# Patient Record
Sex: Female | Born: 1965 | Hispanic: No | Smoking: Never smoker
Health system: Southern US, Community
[De-identification: ages and names within clinical notes are randomized; demographics above are authoritative.]

---

## 2011-12-13 ENCOUNTER — Ambulatory Visit: Payer: Self-pay | Admitting: Cardiovascular Disease

## 2012-01-01 ENCOUNTER — Ambulatory Visit: Payer: Self-pay | Admitting: Cardiovascular Disease

## 2015-08-30 ENCOUNTER — Ambulatory Visit: Payer: Medicaid Other | Attending: Internal Medicine

## 2015-09-20 ENCOUNTER — Ambulatory Visit: Payer: Self-pay | Admitting: Family Medicine

## 2015-09-24 ENCOUNTER — Telehealth: Payer: Self-pay | Admitting: *Deleted

## 2015-09-24 NOTE — Telephone Encounter (Signed)
Unable to reach patient at time of Pre-Visit Call. No voicemail setup. 

## 2015-09-27 ENCOUNTER — Ambulatory Visit: Payer: Self-pay | Admitting: Family Medicine

## 2015-11-04 ENCOUNTER — Other Ambulatory Visit (HOSPITAL_BASED_OUTPATIENT_CLINIC_OR_DEPARTMENT_OTHER): Payer: Self-pay

## 2015-11-04 DIAGNOSIS — G4730 Sleep apnea, unspecified: Secondary | ICD-10-CM

## 2015-11-04 DIAGNOSIS — R0683 Snoring: Secondary | ICD-10-CM

## 2015-12-17 ENCOUNTER — Ambulatory Visit (HOSPITAL_BASED_OUTPATIENT_CLINIC_OR_DEPARTMENT_OTHER): Payer: Medicaid Other

## 2016-02-29 ENCOUNTER — Ambulatory Visit (HOSPITAL_BASED_OUTPATIENT_CLINIC_OR_DEPARTMENT_OTHER): Payer: Medicaid Other | Attending: Specialist | Admitting: Internal Medicine

## 2016-02-29 VITALS — Ht 62.0 in | Wt 282.0 lb

## 2016-02-29 DIAGNOSIS — E669 Obesity, unspecified: Secondary | ICD-10-CM | POA: Insufficient documentation

## 2016-02-29 DIAGNOSIS — G4733 Obstructive sleep apnea (adult) (pediatric): Secondary | ICD-10-CM | POA: Diagnosis not present

## 2016-02-29 DIAGNOSIS — G4730 Sleep apnea, unspecified: Secondary | ICD-10-CM | POA: Diagnosis present

## 2016-02-29 DIAGNOSIS — R5383 Other fatigue: Secondary | ICD-10-CM | POA: Diagnosis not present

## 2016-02-29 DIAGNOSIS — Z6841 Body Mass Index (BMI) 40.0 and over, adult: Secondary | ICD-10-CM | POA: Diagnosis not present

## 2016-02-29 DIAGNOSIS — R0683 Snoring: Secondary | ICD-10-CM

## 2016-03-05 DIAGNOSIS — R0683 Snoring: Secondary | ICD-10-CM | POA: Diagnosis not present

## 2016-03-05 NOTE — Procedures (Signed)
Patient Name: Jocelyn Hart, Jocelyn Hart Date: 02/29/2016 Gender: Female D.O.B: 1965/10/01 Age (years): 39 Referring Provider: Jac Canavan MD Height (inches): 62 Interpreting Physician: Baird Lyons MD, ABSM Weight (lbs): 282 RPSGT: Carolin Coy BMI: 16 MRN: [id number] Neck Size: 15.00 CLINICAL INFORMATION Sleep Study Type: Split Night CPAP  Indication for sleep study: Fatigue, Obesity, OSA, Snoring  Epworth Sleepiness Score: 6  SLEEP STUDY TECHNIQUE As per the AASM Manual for the Scoring of Sleep and Associated Events v2.3 (April 2016) with a hypopnea requiring 4% desaturations.  The channels recorded and monitored were frontal, central and occipital EEG, electrooculogram (EOG), submentalis EMG (chin), nasal and oral airflow, thoracic and abdominal wall motion, anterior tibialis EMG, snore microphone, electrocardiogram, and pulse oximetry. Continuous positive airway pressure (CPAP) was initiated when the patient met split night criteria and was titrated according to treat sleep-disordered breathing.  MEDICATIONS Medications self-administered by patient taken the night of the study : none reported  RESPIRATORY PARAMETERS Diagnostic  Total AHI (/hr): 73.3 RDI (/hr): 77.5 OA Index (/hr): 4.6 CA Index (/hr): 0.0 REM AHI (/hr): 96.0 NREM AHI (/hr): 70.2 Supine AHI (/hr): 64.6 Non-supine AHI (/hr): 97.67 Min O2 Sat (%): 74.00 Mean O2 (%): 93.85 Time below 88% (min): 6.5   Titration  Optimal Pressure (cm): 12 AHI at Optimal Pressure (/hr): 0.0 Min O2 at Optimal Pressure (%): 94.0 Supine % at Optimal (%): 100 Sleep % at Optimal (%): 100    SLEEP ARCHITECTURE The recording time for the entire night was 396.5 minutes.  During a baseline period of 186.9 minutes, the patient slept for 144.0 minutes in REM and nonREM, yielding a sleep efficiency of 77.1%. Sleep onset after lights out was 22.3 minutes with a REM latency of 121.0 minutes. The patient spent 13.19% of the night in stage  N1 sleep, 74.65% in stage N2 sleep, 0.00% in stage N3 and 12.15% in REM.  During the titration period of 201.0 minutes, the patient slept for 189.0 minutes in REM and nonREM, yielding a sleep efficiency of 94.0%. Sleep onset after CPAP initiation was 8.3 minutes with a REM latency of 83.5 minutes. The patient spent 4.23% of the night in stage N1 sleep, 65.87% in stage N2 sleep, 0.00% in stage N3 and 29.89% in REM.  CARDIAC DATA The 2 lead EKG demonstrated sinus rhythm. The mean heart rate was 76.46 beats per minute. Other EKG findings include: None.  LEG MOVEMENT DATA The total Periodic Limb Movements of Sleep (PLMS) were 0. The PLMS index was 0.00 .  IMPRESSIONS - Severe obstructive sleep apnea occurred during the diagnostic portion of the study (AHI = 73.3/hour). An optimal PAP pressure was selected for this patient ( 12 cm of water) - No significant central sleep apnea occurred during the diagnostic portion of the study (CAI = 0.0/hour). - Mild oxygen desaturation was noted during the diagnostic portion of the study (Min O2 = 74.00%). - The patient snored with Moderate snoring volume during the diagnostic portion of the study. - No cardiac abnormalities were noted during this study. - Clinically significant periodic limb movements did not occur during sleep.  DIAGNOSIS - Obstructive Sleep Apnea (327.23 [G47.33 ICD-10])  RECOMMENDATIONS - Trial of CPAP therapy on 12 cm H2O with a Medium size Resmed Full Face Mask AirFit F20 mask and heated humidification. - Avoid alcohol, sedatives and other CNS depressants that may worsen sleep apnea and disrupt normal sleep architecture. - Sleep hygiene should be reviewed to assess factors that may improve sleep quality. - Weight management  and regular exercise should be initiated or continued.  [Electronically signed] 03/05/2016 10:15 AM  Baird Lyons MD, ABSM Diplomate, American Board of Sleep Medicine   NPI: [id number]  [place], American Board of Sleep Medicine  ELECTRONICALLY SIGNED ON:  03/05/2016, 10:16 AM [place] PH: [phone]   FX: [phone] [place]

## 2016-03-07 ENCOUNTER — Other Ambulatory Visit: Payer: Self-pay | Admitting: Cardiology

## 2016-03-07 ENCOUNTER — Ambulatory Visit (INDEPENDENT_AMBULATORY_CARE_PROVIDER_SITE_OTHER): Payer: Medicaid Other

## 2016-03-07 ENCOUNTER — Encounter: Payer: Self-pay | Admitting: Cardiology

## 2016-03-07 ENCOUNTER — Ambulatory Visit (INDEPENDENT_AMBULATORY_CARE_PROVIDER_SITE_OTHER): Payer: Medicaid Other | Admitting: Internal Medicine

## 2016-03-07 ENCOUNTER — Other Ambulatory Visit: Payer: Self-pay

## 2016-03-07 ENCOUNTER — Encounter: Payer: Self-pay | Admitting: Internal Medicine

## 2016-03-07 ENCOUNTER — Ambulatory Visit (INDEPENDENT_AMBULATORY_CARE_PROVIDER_SITE_OTHER): Payer: Medicaid Other | Admitting: Cardiology

## 2016-03-07 VITALS — BP 138/72 | HR 85 | Ht 61.0 in | Wt 276.0 lb

## 2016-03-07 VITALS — BP 170/106 | HR 75 | Ht 62.0 in | Wt 275.8 lb

## 2016-03-07 DIAGNOSIS — I10 Essential (primary) hypertension: Secondary | ICD-10-CM | POA: Diagnosis not present

## 2016-03-07 DIAGNOSIS — E6609 Other obesity due to excess calories: Secondary | ICD-10-CM | POA: Diagnosis not present

## 2016-03-07 DIAGNOSIS — IMO0001 Reserved for inherently not codable concepts without codable children: Secondary | ICD-10-CM

## 2016-03-07 DIAGNOSIS — R0602 Shortness of breath: Secondary | ICD-10-CM

## 2016-03-07 DIAGNOSIS — M79606 Pain in leg, unspecified: Secondary | ICD-10-CM

## 2016-03-07 DIAGNOSIS — G4733 Obstructive sleep apnea (adult) (pediatric): Secondary | ICD-10-CM

## 2016-03-07 DIAGNOSIS — M79604 Pain in right leg: Secondary | ICD-10-CM

## 2016-03-07 DIAGNOSIS — Z6841 Body Mass Index (BMI) 40.0 and over, adult: Secondary | ICD-10-CM

## 2016-03-07 NOTE — Progress Notes (Signed)
[facility] Consultation      Date: 03/07/2016,   MRN# [id number] Jocelyn Hart 03-20-1965 Code Status:  Code Status History    This patient does not have a recorded code status. Please follow your organizational policy for patients in this situation.     Hosp day:@LENGTHOFSTAYDAYS @ Referring MD: @ATDPROV @     PCP:      AdmissionWeight: 276 lb (125.2 kg)                 CurrentWeight: 276 lb (125.2 kg) Jocelyn FoyerZilehuma Zeek is a 51 y.o. old female seen in consultation for sleep problems at the request of Dr. Shawnie Ponsorn.     CHIEF COMPLAINT:   Problems with excessive daytime fatigue and sleepiness   HISTORY OF PRESENT ILLNESS   51 yo pleasant obese female seen today for problems with sleeping, snoring and fatigue, this has been going on for about 2 years  Patient has been having excessive daytime sleepiness Patient has been having extreme fatigue and tiredness, lack of energy Patient has been told that she has very  Loud snoring every night Patient has been told that she struggles to breathe at night and gasps for air She also states that she is sometimes depressed and has memory loss She has gained 60 pounds in last 1 years-she feels her sleep problems affecting her mood Patient has sleep study that confirms OSA with AHI 73 Patient was placed on full mask CPAP 12 cm h20 during the study, she states that she felt good after her sleep study Patient here for assessment and establish care Pateint   PAST MEDICAL HISTORY   No past medical history on file.   SURGICAL HISTORY   Past Surgical History:  Procedure Laterality Date  . CESAREAN SECTION       FAMILY HISTORY   Family History  Problem Relation Age of Onset  . Diabetes Mother   . Breast cancer Mother   . Diabetes Father   . Stroke Paternal Grandfather      SOCIAL HISTORY   Social History  Substance Use Topics  . Smoking status: Never Smoker  . Smokeless tobacco: Never Used  . Alcohol use  No  works as Marketing executivelinguist and truck driver Works as   MEDICATIONS    Home Medication:  Current Outpatient Rx  . Order #: [id number] Class: Historical Med    Current Medication:  Current Outpatient Prescriptions:  .  ibuprofen (ADVIL,MOTRIN) 800 MG tablet, Take 800 mg by mouth every 8 (eight) hours as needed., Disp: , Rfl:     ALLERGIES   Patient has no allergy information on record.     REVIEW OF SYSTEMS   Review of Systems  Constitutional: Positive for malaise/fatigue. Negative for chills, diaphoresis, fever and weight loss.  HENT: Negative for congestion and hearing loss.   Eyes: Negative for blurred vision and double vision.  Respiratory: Positive for shortness of breath. Negative for cough, hemoptysis, sputum production and wheezing.   Cardiovascular: Negative for chest pain, palpitations and orthopnea.  Gastrointestinal: Negative for abdominal pain, heartburn, nausea and vomiting.  Genitourinary: Negative for dysuria and urgency.  Musculoskeletal: Negative for back pain, myalgias and neck pain.  Skin: Negative for rash.  Neurological: Negative for dizziness, tingling, tremors, weakness and headaches.  Endo/Heme/Allergies: Does not bruise/bleed easily.  Psychiatric/Behavioral: Negative for depression, substance abuse and suicidal ideas.  All other systems reviewed and are negative.    VS: BP 138/72 (BP Location: Left Arm, Cuff Size: Normal)   Pulse 85  Ht 5\' 1"  (1.549 m)   Wt 276 lb (125.2 kg)   SpO2 95%   BMI 52.15 kg/m      PHYSICAL EXAM  Physical Exam  Constitutional: She is oriented to person, place, and time. She appears well-developed and well-nourished. No distress.  HENT:  Head: Normocephalic and atraumatic.  Mouth/Throat: No oropharyngeal exudate.  Eyes: EOM are normal. Pupils are equal, round, and reactive to light. No scleral icterus.  Neck: Normal range of motion. Neck supple.  Cardiovascular: Normal rate, regular rhythm and normal heart  sounds.   No murmur heard. Pulmonary/Chest: No stridor. No respiratory distress. She has no wheezes.  Abdominal: Soft. Bowel sounds are normal.  Musculoskeletal: Normal range of motion. She exhibits no edema.  Neurological: She is alert and oriented to person, place, and time. No cranial nerve deficit.  Skin: Skin is warm. She is not diaphoretic.  Psychiatric: She has a normal mood and affect.       IMAGING  No imaging found in chart  No imaging  ASSESSMENT/PLAN   51 yo pleasant [place]  Female with dx of severe OSA with AHI 73  1.recommend diet and exercise and weight loss 2.will start CPAP therapy at 12 cm h20 and then re-assess AHI with compliance report  Follow up in 2-3 months    I have personally obtained a history, examined the patient, evaluated laboratory and independently reviewed imaging results, formulated the assessment and plan and placed orders.  The Patient requires high complexity decision making for assessment and support, frequent evaluation and titration of therapies, application of advanced monitoring technologies and extensive interpretation of multiple databases.   Patient/Family are satisfied with Plan of action and management. All questions answered  Lucie Leather, M.D.  [facility]  Medical Director [facility] Medical Director [facility] Cardio-Pulmonary Department

## 2016-03-07 NOTE — Patient Instructions (Signed)
MASK OF CHOICE FOR SLEEP APNEA START CPAP 12 cm h20 Recommend Weight Loss

## 2016-03-07 NOTE — Patient Instructions (Addendum)
Testing/Procedures: Your physician has requested that you have an echocardiogram. Echocardiography is a painless test that uses sound waves to create images of your heart. It provides your doctor with information about the size and shape of your heart and how well your heart's chambers and valves are working. This procedure takes approximately one hour. There are no restrictions for this procedure.  Your physician has requested that you have an ankle brachial index (ABI). During this test an ultrasound and blood pressure cuff are used to evaluate the arteries that supply the arms and legs with blood. Allow thirty minutes for this exam. There are no restrictions or special instructions.  [facility] MY[facility]IEW  Your caregiver has ordered a Stress Test with nuclear imaging. The purpose of this test is to evaluate the blood supply to your heart muscle. This procedure is referred to as a "Non-Invasive Stress Test." This is because other than having an IV started in your vein, nothing is inserted or "invades" your body. Cardiac stress tests are done to find areas of poor blood flow to the heart by determining the extent of coronary artery disease (CAD). Some patients exercise on a treadmill, which naturally increases the blood flow to your heart, while others who are  unable to walk on a treadmill due to physical limitations have a pharmacologic/chemical stress agent called Lexiscan . This medicine will mimic walking on a treadmill by temporarily increasing your coronary blood flow.   Please note: these test may take anywhere between 2-4 hours to complete  [facility]  THE VOLUNTEERS AT THE FIRST DESK WILL DIRECT YOU WHERE TO GO  Date of Procedure:_Tuesday March 14, 2016 at 07:30AM_  Arrival Time for Procedure:_Arrive at 07:15AM__   PLEASE NOTIFY THE OFFICE AT LEAST 24 HOURS IN ADVANCE IF YOU ARE UNABLE TO KEEP YOUR APPOINTMENT.  [phone] AND  PLEASE NOTIFY NUCLEAR  MEDICINE AT [facility] AT LEAST 24 HOURS IN ADVANCE IF YOU ARE UNABLE TO KEEP YOUR APPOINTMENT. [phone]  How to prepare for your Myoview test:  1. Do not eat or drink after midnight 2. No caffeine for 24 hours prior to test 3. No smoking 24 hours prior to test. 4. Your medication may be taken with water.  If your doctor stopped a medication because of this test, do not take that medication. 5. Ladies, please do not wear dresses.  Skirts or pants are appropriate. Please wear a short sleeve shirt. 6. No perfume, cologne or lotion. 7. Wear comfortable walking shoes. No heels!      Follow-Up: Your physician has requested that you regularly monitor and record your blood pressure readings at home. Please use the same machine at the same time of day to check your readings and record them to bring to your follow-up visit.   Your physician recommends that you schedule a follow-up appointment after testing with Dr. Alvino ChapelIngal.  It was a pleasure seeing you today here in the office. Please do not hesitate to give us a call back if you have any further questions. [phone]  [place] CellarPamela A. RN, BSN    Echocardiogram An echocardiogram, or echocardiography, uses sound waves (ultrasound) to produce an image of your heart. The echocardiogram is simple, painless, obtained within a short period of time, and offers valuable information to your health care provider. The images from an echocardiogram can provide information such as:  Evidence of coronary artery disease (CAD).  Heart size.  Heart muscle function.  Heart valve function.  Aneurysm detection.  Evidence of a past heart attack.  Fluid buildup around the heart.  Heart muscle thickening.  Assess heart valve function. Tell a health care provider about:  Any allergies you have.  All medicines you are taking, including vitamins, herbs, eye drops, creams, and over-the-counter medicines.  Any problems you or family members have had with  anesthetic medicines.  Any blood disorders you have.  Any surgeries you have had.  Any medical conditions you have.  Whether you are pregnant or may be pregnant. What happens before the procedure? No special preparation is needed. Eat and drink normally. What happens during the procedure?  In order to produce an image of your heart, gel will be applied to your chest and a wand-like tool (transducer) will be moved over your chest. The gel will help transmit the sound waves from the transducer. The sound waves will harmlessly bounce off your heart to allow the heart images to be captured in real-time motion. These images will then be recorded.  You may need an IV to receive a medicine that improves the quality of the pictures. What happens after the procedure? You may return to your normal schedule including diet, activities, and medicines, unless your health care provider tells you otherwise. This information is not intended to replace advice given to you by your health care provider. Make sure you discuss any questions you have with your health care provider. Document Released: 01/14/2000 Document Revised: 09/04/2015 Document Reviewed: 09/23/2012 Elsevier Interactive Patient Education  2017 Elsevier Inc. Ankle-Brachial Index Test Introduction The ankle-brachial index (ABI) test is used to find peripheral vascular disease (PVD). PVD is also known as peripheral arterial disease (PAD). PVD is the blocking or hardening of the arteries anywhere within the circulatory system beyond the heart. PVD is caused by cholesterol deposits in your blood vessels (atherosclerosis). These deposits cause arteries to narrow. The delivery of oxygen to your tissues is impaired as a result. This can cause muscle pain and fatigue. This is called claudication. PVD means there may also be buildup of cholesterol in your:  Heart. This increases the risk of heart attacks.  Brain. This increases the risk of  strokes. The ankle-brachial index test measures the blood flow in your arms and legs. This test also determines if blood vessels in your leg are narrowed by cholesterol deposits. There are additional causes of a reduced ankle-brachial index, such as inflammation of vessels or a clot in the vessels. However, these are much less common than narrowing due to cholesterol deposits. What is being tested? The test is done while you are lying down and resting. Measurements are taken of the systolic pressure:  In your arm (brachial).  In your ankle at several points along your leg. Systolic pressure is the pressure inside your arteries when your heart pumps. The measurements are taken several times on both sides. Then, the highest systolic pressure of the ankle is divided by the highest brachial systolic pressure. The result is the ankle-brachial pressure ratio, or ABI. Sometimes this test is repeated after you have exercised on a treadmill for five minutes. You may have leg pain during the exercise portion of the test if you suffer from PAD. If the index number drops after exercise, this may show that PAD is present. A normal ABI ratio is between 0.9 and 1.4. A value below 0.9 is considered abnormal. This information is not intended to replace advice given to you by your health care provider. Make sure you discuss any questions you have  with your health care provider. Document Released: 01/21/2004 Document Revised: 06/24/2015 Document Reviewed: 08/22/2013  2017 Elsevier Pharmacologic Stress Electrocardiogram A pharmacologic stress electrocardiogram is a heart (cardiac) test that uses nuclear imaging to evaluate the blood supply to your heart. This test may also be called a pharmacologic stress electrocardiography. Pharmacologic means that a medicine is used to increase your heart rate and blood pressure.  This stress test is done to find areas of poor blood flow to the heart by determining the extent of  coronary artery disease (CAD). Some people exercise on a treadmill, which naturally increases the blood flow to the heart. For those people unable to exercise on a treadmill, a medicine is used. This medicine stimulates your heart and will cause your heart to beat harder and more quickly, as if you were exercising.  Pharmacologic stress tests can help determine:  The adequacy of blood flow to your heart during increased levels of activity in order to clear you for discharge home.  The extent of coronary artery blockage caused by CAD.  Your prognosis if you have suffered a heart attack.  The effectiveness of cardiac procedures done, such as an angioplasty, which can increase the circulation in your coronary arteries.  Causes of chest pain or pressure. LET [facility] CARE PROVIDER KNOW ABOUT:  Any allergies you have.  All medicines you are taking, including vitamins, herbs, eye drops, creams, and over-the-counter medicines.  Previous problems you or members of your family have had with the use of anesthetics.  Any blood disorders you have.  Previous surgeries you have had.  Medical conditions you have.  Possibility of pregnancy, if this applies.  If you are currently breastfeeding. RISKS AND COMPLICATIONS Generally, this is a safe procedure. However, as with any procedure, complications can occur. Possible complications include:  You develop pain or pressure in the following areas:  Chest.  Jaw or neck.  Between your shoulder blades.  Radiating down your left arm.  Headache.  Dizziness or light-headedness.  Shortness of breath.  Increased or irregular heartbeat.  Low blood pressure.  Nausea or vomiting.  Flushing.  Redness going up the arm and slight pain during injection of medicine.  Heart attack (rare). BEFORE THE PROCEDURE   Avoid all forms of caffeine for 24 hours before your test or as directed by your health care provider. This includes coffee, tea  (even decaffeinated tea), caffeinated sodas, chocolate, cocoa, and certain pain medicines.  Follow your health care provider's instructions regarding eating and drinking before the test.  Take your medicines as directed at regular times with water unless instructed otherwise. Exceptions may include:  If you have diabetes, ask how you are to take your insulin or pills. It is common to adjust insulin dosing the morning of the test.  If you are taking beta-blocker medicines, it is important to talk to your health care provider about these medicines well before the date of your test. Taking beta-blocker medicines may interfere with the test. In some cases, these medicines need to be changed or stopped 24 hours or more before the test.  If you wear a nitroglycerin patch, it may need to be removed prior to the test. Ask your health care provider if the patch should be removed before the test.  If you use an inhaler for any breathing condition, bring it with you to the test.  If you are an outpatient, bring a snack so you can eat right after the stress phase of the test.  Do not smoke for 4 hours prior to the test or as directed by your health care provider.  Do not apply lotions, powders, creams, or oils on your chest prior to the test.  Wear comfortable shoes and clothing. Let your health care provider know if you were unable to complete or follow the preparations for your test. PROCEDURE   Multiple patches (electrodes) will be put on your chest. If needed, small areas of your chest may be shaved to get better contact with the electrodes. Once the electrodes are attached to your body, multiple wires will be attached to the electrodes, and your heart rate will be monitored.  An IV access will be started. A nuclear trace (isotope) is given. The isotope may be given intravenously, or it may be swallowed. Nuclear refers to several types of radioactive isotopes, and the nuclear isotope lights up the  arteries so that the nuclear images are clear. The isotope is absorbed by your body. This results in low radiation exposure.  A resting nuclear image is taken to show how your heart functions at rest.  A medicine is given through the IV access.  A second scan is done about 1 hour after the medicine injection and determines how your heart functions under stress.  During this stress phase, you will be connected to an electrocardiogram machine. Your blood pressure and oxygen levels will be monitored. AFTER THE PROCEDURE   Your heart rate and blood pressure will be monitored after the test.  You may return to your normal schedule, including diet,activities, and medicines, unless your health care provider tells you otherwise. This information is not intended to replace advice given to you by your health care provider. Make sure you discuss any questions you have with your health care provider. Document Released: 06/04/2008 Document Revised: 01/21/2013 Document Reviewed: 09/23/2012 Elsevier Interactive Patient Education  2017 [employer].

## 2016-03-07 NOTE — Progress Notes (Signed)
Cardiology Office Note   Date:  03/07/2016   ID:  Jocelyn FoyerZilehuma Rusher, DOB Aug 20, 1965, MRN [id number]  Referring Doctor:  No primary care provider on file.   Cardiologist:   Almond LintAileen Torrion Witter, MD   Reason for consultation:  Chief Complaint  Patient presents with  . other     New patient. Patient c/o elevated BP, leg pain . Meds reviewed verbally with patient.       History of Present Illness: Jocelyn Hart is a 51 y.o. female who presents for Evaluation of elevated blood pressure, leg pain.  Patient reports that her blood pressure has been up and down for the last one or 2 years, more noticeable in the last 6 months or so. She was previously prescribed amlodipine 5 mg by mouth daily but only took it once due to possible side effect, did not feel well after taking the amlodipine. She went to her gynecologist yesterday and was told that she needed to see her PCP because her blood pressure was very high, in the stroke range. She established care with a new PCP and was recommended to keep this cardiology appointment today. She was not prescribed any medications. They did order some routine blood work.  Yesterday, at the PCPs office, her blood pressure was in the 180s systolic. She denied chest pain and shortness of breath at the time. No lightheadedness or dizziness. She did complain of just not feeling well over all, left arm discomfort at that time.  She does report shortness of breath with exertion. No chest pain. She mentions that she had seen a physician in the past and was told that she needed to do a stress test. She did not go for that stress test.  She was recently diagnosed to have severe obstructive sleep apnea and is waiting for CPAP.  She reports of pain in the back of the legs, more on the right versus left., probably worse with walking. She is not clear about this symptom because she also mentions that there is some cramping sensation at rest as well.     ROS:  Please see the  history of present illness. Aside from mentioned under HPI, all other systems are reviewed and negative.     History reviewed. No pertinent past medical history.  Past Surgical History:  Procedure Laterality Date  . CESAREAN SECTION       reports that she has never smoked. She has never used smokeless tobacco. She reports that she does not drink alcohol or use drugs.   family history includes Breast cancer in her mother; Diabetes in her father and mother; Stroke in her paternal grandfather.   Outpatient Medications Prior to Visit  Medication Sig Dispense Refill  . ibuprofen (ADVIL,MOTRIN) 800 MG tablet Take 800 mg by mouth every 8 (eight) hours as needed.     No facility-administered medications prior to visit.      Allergies: Patient has no allergy information on record.    PHYSICAL EXAM: VS:  BP (!) 170/106 (BP Location: Right Arm, Patient Position: Sitting, Cuff Size: Large)   Pulse 75   Ht 5\' 2"  (1.575 m)   Wt 275 lb 12 oz (125.1 kg)   BMI 50.44 kg/m  , Body mass index is 50.44 kg/m. Wt Readings from Last 3 Encounters:  03/07/16 275 lb 12 oz (125.1 kg)  03/07/16 276 lb (125.2 kg)  02/29/16 282 lb (127.9 kg)    GENERAL:  well developed, well nourished, morbidly obese, not in acute  distress HEENT: normocephalic, pink conjunctivae, anicteric sclerae, no xanthelasma, normal dentition, oropharynx clear NECK:  no neck vein engorgement, JVP normal, no hepatojugular reflux, carotid upstroke brisk and symmetric, no bruit, no thyromegaly, no lymphadenopathy LUNGS:  good respiratory effort, clear to auscultation bilaterally CV:  PMI not displaced, no thrills, no lifts, S1 and S2 within normal limits, no palpable S3 or S4, no murmurs, no rubs, no gallops ABD:  Soft, nontender, normoactive bowel sounds, difficult to assess for aortic bruit, hepatomegaly, splenomegaly MS: nontender back, no kyphosis, no scoliosis, no joint deformities EXT:  1+ DP/PT pulses, no edema, no  varicosities, no cyanosis, no clubbing SKIN: warm, nondiaphoretic, normal turgor, no ulcers NEUROPSYCH: alert, oriented to person, place, and time, sensory/motor grossly intact, normal mood, appropriate affect  Recent Labs: No results found for requested labs within last 8760 hours.   Lipid Panel No results found for: CHOL, TRIG, HDL, CHOLHDL, VLDL, LDLCALC, LDLDIRECT   Other studies Reviewed:  EKG:  The ekg from 03/07/2016 was personally reviewed by me and it revealed sinus rhythm, 75 BPM. Possible right atrial enlargement.  Additional studies/ records that were reviewed personally reviewed by me today include: None available   ASSESSMENT AND PLAN: Elevated blood pressure, likely hypertension Resume amlodipine 5 mg by mouth daily Blood pressure log. May need to up titrate if not goal of less than 140/80. Salt restriction. Weight loss. Avoid NSAIDs. Agree with CPAP for OSA.  Shortness breath Recommend echocardiogram Recommend Lexiscan stress test.   Leg pain Possible claudication. Recommend ABiS. Recommend follow-up with PCP for further evaluation as well. Consider spine imaging to see if this is related to stenosis.  Obesity Body mass index is 50.44 kg/m.Marland Kitchen Recommend aggressive weight loss through diet and increased physical activity.    Current medicines are reviewed at length with the patient today.  The patient does not have concerns regarding medicines.  Labs/ tests ordered today include:  Orders Placed This Encounter  Procedures  . NM Myocar Multi W/Spect W/Wall Motion / EF  . EKG 12-Lead  . ECHOCARDIOGRAM COMPLETE    I had a lengthy and detailed discussion with the patient regarding diagnoses, prognosis, diagnostic options, treatment options  and side effects of medications.   I counseled the patient on importance of lifestyle modification including heart healthy diet, regular physical activity .   Disposition:   FU with undersigned after tests  Thank  you for this consultation. We will forwarding this consultation to referring physician.   Signed, Almond Lint, MD  03/07/2016 12:59 PM    [facility]  This note was generated in part with voice recognition software and I apologize for any typographical errors that were not detected and corrected.

## 2016-03-08 ENCOUNTER — Ambulatory Visit: Payer: Medicaid Other

## 2016-03-08 DIAGNOSIS — I7389 Other specified peripheral vascular diseases: Secondary | ICD-10-CM | POA: Diagnosis not present

## 2016-03-08 DIAGNOSIS — R202 Paresthesia of skin: Secondary | ICD-10-CM | POA: Diagnosis not present

## 2016-03-08 DIAGNOSIS — M79606 Pain in leg, unspecified: Secondary | ICD-10-CM

## 2016-03-08 LAB — ECHOCARDIOGRAM COMPLETE
Height: 62 in
Weight: 4412 oz

## 2016-03-14 ENCOUNTER — Other Ambulatory Visit
Admission: RE | Admit: 2016-03-14 | Discharge: 2016-03-14 | Disposition: A | Discharge status: Home / Self Care | Payer: Medicaid Other | Source: Ambulatory Visit | Attending: Family Medicine | Admitting: Family Medicine

## 2016-03-14 ENCOUNTER — Ambulatory Visit
Admission: RE | Admit: 2016-03-14 | Discharge: 2016-03-14 | Disposition: A | Discharge status: Home / Self Care | Payer: Medicaid Other | Source: Ambulatory Visit | Attending: Cardiology | Admitting: Cardiology

## 2016-03-14 DIAGNOSIS — R0602 Shortness of breath: Secondary | ICD-10-CM

## 2016-03-14 DIAGNOSIS — I10 Essential (primary) hypertension: Secondary | ICD-10-CM | POA: Insufficient documentation

## 2016-03-14 LAB — NM MYOCAR MULTI W/SPECT W/WALL MOTION / EF
CHL CUP RESTING HR STRESS: 72 {beats}/min
CHL CUP STRESS STAGE 1 SPEED: 0 mph
CHL CUP STRESS STAGE 2 GRADE: 0 %
CHL CUP STRESS STAGE 2 HR: 83 {beats}/min
CHL CUP STRESS STAGE 2 SPEED: 0 mph
CHL CUP STRESS STAGE 3 HR: 96 {beats}/min
CHL CUP STRESS STAGE 4 SPEED: 0 mph
CHL CUP STRESS STAGE 5 DBP: 75 mmHg
CHL CUP STRESS STAGE 5 HR: 83 {beats}/min
CSEPEW: 1 METS
CSEPHR: 56 %
CSEPPMHR: 55 %
LV dias vol: 79 mL (ref 46–106)
LV sys vol: 30 mL
Peak HR: 96 {beats}/min
Stage 1 Grade: 0 %
Stage 1 HR: 82 {beats}/min
Stage 3 Grade: 0 %
Stage 3 Speed: 0 mph
Stage 4 Grade: 0 %
Stage 4 HR: 93 {beats}/min
Stage 5 Grade: 0 %
Stage 5 SBP: 127 mmHg
Stage 5 Speed: 0 mph
TID: 1.14

## 2016-03-14 LAB — PREGNANCY, URINE: PREG TEST UR: NEGATIVE

## 2016-03-14 MED ORDER — TECHNETIUM TC 99M TETROFOSMIN IV KIT: 33.0000 | PACK | Freq: Once | INTRAVENOUS | Status: DC | PRN

## 2016-03-14 MED ORDER — REGADENOSON 0.4 MG/5ML IV SOLN
0.4000 mg | Freq: Once | INTRAVENOUS | Status: AC
  Administered 2016-03-14: 0.4 mg via INTRAVENOUS
  Filled 2016-03-14: qty 5

## 2016-03-14 MED ORDER — TECHNETIUM TC 99M TETROFOSMIN IV KIT
13.0000 | PACK | Freq: Once | INTRAVENOUS | Status: AC | PRN
  Administered 2016-03-14: 13.08 via INTRAVENOUS

## 2016-03-14 MED ORDER — TECHNETIUM TC 99M TETROFOSMIN IV KIT
30.0270 | PACK | Freq: Once | INTRAVENOUS | Status: AC | PRN
  Administered 2016-03-14: 30.027 via INTRAVENOUS

## 2016-03-21 ENCOUNTER — Encounter: Payer: Self-pay | Admitting: Cardiology

## 2016-03-21 ENCOUNTER — Ambulatory Visit: Payer: Medicaid Other | Admitting: Cardiology

## 2016-03-21 ENCOUNTER — Ambulatory Visit (INDEPENDENT_AMBULATORY_CARE_PROVIDER_SITE_OTHER): Payer: Medicaid Other | Admitting: Cardiology

## 2016-03-21 VITALS — BP 140/90 | HR 90 | Ht 61.0 in | Wt 277.5 lb

## 2016-03-21 DIAGNOSIS — R0602 Shortness of breath: Secondary | ICD-10-CM

## 2016-03-21 DIAGNOSIS — Z6841 Body Mass Index (BMI) 40.0 and over, adult: Secondary | ICD-10-CM

## 2016-03-21 DIAGNOSIS — E6609 Other obesity due to excess calories: Secondary | ICD-10-CM

## 2016-03-21 DIAGNOSIS — IMO0001 Reserved for inherently not codable concepts without codable children: Secondary | ICD-10-CM

## 2016-03-21 DIAGNOSIS — I10 Essential (primary) hypertension: Secondary | ICD-10-CM

## 2016-03-21 NOTE — Progress Notes (Signed)
other

## 2016-03-21 NOTE — Progress Notes (Signed)
Cardiology Office Note   Date:  03/21/2016   ID:  Jocelyn Hart, DOB 1965/08/22, MRN [id number]  Referring Doctor:  No PCP Per Patient   Cardiologist:   Almond Lint, MD   Reason for consultation:  Chief Complaint  Patient presents with  . other    echo/abi fu. Pt states she's been feeling much better since starting CPAP machine. Reviewed meds with pt verbally.      History of Present Illness: Jocelyn Hart is a 51 y.o. female who presents forFollow-up after testing, hypertension   Review records from 03/07/2016: Patient reports that her blood pressure has been up and down for the last one or 2 years, more noticeable in the last 6 months or so. She was previously prescribed amlodipine 5 mg by mouth daily but only took it once due to possible side effect, did not feel well after taking the amlodipine. She went to her gynecologist yesterday and was told that she needed to see her PCP because her blood pressure was very high, in the stroke range. She established care with a new PCP and was recommended to keep this cardiology appointment today. She was not prescribed any medications. They did order some routine blood work. They prior office visit, at the PCPs office, her blood pressure was in the 180s systolic. She denied chest pain and shortness of breath at the time. No lightheadedness or dizziness. She did complain of just not feeling well over all, left arm discomfort at that time.  Since last visit, she has been feeling much better after using CPAP. She was Switched from amlodipine to hydrochlorothiazide. She continues to take that medication. Her blood pressures have been getting better now down to 130 systolic.  Patient has no chest pain, shortness of breath, palpitations, leg pains. No syncope. No palpitations.   ROS:  Please see the history of present illness. Aside from mentioned under HPI, all other systems are reviewed and negative.    History reviewed. No pertinent past  medical history.  Past Surgical History:  Procedure Laterality Date  . CESAREAN SECTION       reports that she has never smoked. She has never used smokeless tobacco. She reports that she does not drink alcohol or use drugs.   family history includes Breast cancer in her mother; Diabetes in her father and mother; Stroke in her paternal grandfather.   Outpatient Medications Prior to Visit  Medication Sig Dispense Refill  . ibuprofen (ADVIL,MOTRIN) 800 MG tablet Take 800 mg by mouth every 8 (eight) hours as needed.     No facility-administered medications prior to visit.      Allergies: Patient has no allergy information on record.    PHYSICAL EXAM: VS:  BP 140/90 (BP Location: Left Arm, Patient Position: Sitting, Cuff Size: Large)   Pulse 90   Ht 5\' 1"  (1.549 m)   Wt 277 lb 8 oz (125.9 kg)   BMI 52.43 kg/m  , Body mass index is 52.43 kg/m. Wt Readings from Last 3 Encounters:  03/21/16 277 lb 8 oz (125.9 kg)  03/07/16 275 lb 12 oz (125.1 kg)  03/07/16 276 lb (125.2 kg)  GENERAL:  well developed, well nourished, obese, not in acute distress HEENT: normocephalic, pink conjunctivae, anicteric sclerae, no xanthelasma, normal dentition, oropharynx clear NECK:  no neck vein engorgement, JVP normal, no hepatojugular reflux, carotid upstroke brisk and symmetric, no bruit, no thyromegaly, no lymphadenopathy LUNGS:  good respiratory effort, clear to auscultation bilaterally CV:  PMI not  displaced, no thrills, no lifts, S1 and S2 within normal limits, no palpable S3 or S4, no murmurs, no rubs, no gallops ABD:  Soft, nontender, nondistended, normoactive bowel sounds, no abdominal aortic bruit, no hepatomegaly, no splenomegaly MS: nontender back, no kyphosis, no scoliosis, no joint deformities EXT:  2+ DP/PT pulses, no edema, no varicosities, no cyanosis, no clubbing SKIN: warm, nondiaphoretic, normal turgor, no ulcers NEUROPSYCH: alert, oriented to person, place, and time, sensory/motor  grossly intact, normal mood, appropriate affect   Recent Labs: No results found for requested labs within last 8760 hours.   Lipid Panel No results found for: CHOL, TRIG, HDL, CHOLHDL, VLDL, LDLCALC, LDLDIRECT   Other studies Reviewed:  EKG:  The ekg from 03/07/2016 was personally reviewed by me and it revealed sinus rhythm, 75 BPM. Possible right atrial enlargement.  Additional studies/ records that were reviewed personally reviewed by me today include:  Echo 03/07/2016: Left ventricle: The cavity size was normal. There was mild   concentric hypertrophy. Systolic function was normal. The   estimated ejection fraction was in the range of 60% to 65%. Wall   motion was normal; there were no regional wall motion   abnormalities. Left ventricular diastolic function parameters   were normal.  Lower extremity ultrasound ABI  03/08/2016: Normal findings  Nuclear stress test 03/14/2016:  Low risk myocardial perfusion stress test without evidence of ischemia or scar.  The left ventricular ejection fraction is normal (62%).   ASSESSMENT AND PLAN: Hypertension Patient reports that she was switched to HCTZ. She is not sure but it sounds like maybe 25 mg by mouth daily. Patient follows up with her PCP for this Continue blood pressure monitoring.  Continue with sodium restriction, weight loss, avoid NSAIDs. Continue with CPAP.   Shortness breath Echocardiogram showed normal LVEF, and diastolic parameters. No evidence of ischemia on stress test. Results discussed with patient, results are reassuring.  Leg pain Unremarkable ABIs. Recommend to continue follow-up with PCP for this.  Obesity Body mass index is 52.43 kg/m.Marland Kitchen. Recommend aggressive weight loss through diet and increased physical activity.     Current medicines are reviewed at length with the patient today.  The patient does not have concerns regarding medicines.  Labs/ tests ordered today include:  No orders of the  defined types were placed in this encounter.   I had a lengthy and detailed discussion with the patient regarding diagnoses, prognosis, diagnostic options, treatment options  and side effects of medications.   I counseled the patient on importance of lifestyle modification including heart healthy diet, regular physical activity .   Disposition:   FU with undersigned prn   Signed, Almond LintAileen Adayah Arocho, MD  03/21/2016 12:20 PM    [facility]  This note was generated in part with voice recognition software and I apologize for any typographical errors that were not detected and corrected.

## 2016-03-21 NOTE — Patient Instructions (Signed)
Follow-Up: Your physician recommends that you schedule a follow-up appointment as needed.   It was a pleasure seeing you today here in the office. Please do not hesitate to give us a call back if you have any further questions. [phone]  Alfreida Steffenhagen A. RN, BSN    

## 2016-04-07 DIAGNOSIS — Z9989 Dependence on other enabling machines and devices: Secondary | ICD-10-CM

## 2016-04-07 DIAGNOSIS — R1312 Dysphagia, oropharyngeal phase: Secondary | ICD-10-CM | POA: Insufficient documentation

## 2016-04-07 DIAGNOSIS — G4733 Obstructive sleep apnea (adult) (pediatric): Secondary | ICD-10-CM | POA: Insufficient documentation

## 2016-04-07 DIAGNOSIS — H9312 Tinnitus, left ear: Secondary | ICD-10-CM | POA: Insufficient documentation

## 2016-04-07 DIAGNOSIS — R0989 Other specified symptoms and signs involving the circulatory and respiratory systems: Secondary | ICD-10-CM | POA: Insufficient documentation

## 2016-04-07 DIAGNOSIS — K219 Gastro-esophageal reflux disease without esophagitis: Secondary | ICD-10-CM | POA: Insufficient documentation

## 2016-04-07 DIAGNOSIS — H9202 Otalgia, left ear: Secondary | ICD-10-CM | POA: Insufficient documentation

## 2016-06-02 ENCOUNTER — Ambulatory Visit (INDEPENDENT_AMBULATORY_CARE_PROVIDER_SITE_OTHER): Payer: Medicaid Other | Admitting: Internal Medicine

## 2016-06-02 ENCOUNTER — Encounter: Payer: Self-pay | Admitting: Internal Medicine

## 2016-06-02 VITALS — BP 136/82 | HR 70 | Resp 16 | Ht <= 58 in | Wt 279.0 lb

## 2016-06-02 DIAGNOSIS — G4733 Obstructive sleep apnea (adult) (pediatric): Secondary | ICD-10-CM

## 2016-06-02 DIAGNOSIS — R0981 Nasal congestion: Secondary | ICD-10-CM

## 2016-06-02 MED ORDER — ALBUTEROL SULFATE HFA 108 (90 BASE) MCG/ACT IN AERS: 2.0000 | INHALATION_SPRAY | RESPIRATORY_TRACT | 6 refills | Status: DC | PRN

## 2016-06-02 NOTE — Patient Instructions (Signed)
Albuterol as needed Follow up ENT Continue CPAP therapy

## 2016-06-02 NOTE — Progress Notes (Signed)
  [facility] Consultation      Date: 06/02/2016,   MRN# [id number] Lia FoyerZilehuma Sharman 13-Jun-1965 Code Status:  Code Status History    This patient does not have a recorded code status. Please follow your organizational policy for patients in this situation.     Hosp day:@LENGTHOFSTAYDAYS @ Referring MD: @ATDPROV @     PCP:      AdmissionWeight: 279 lb (126.6 kg)                 CurrentWeight: 279 lb (126.6 kg) Lia FoyerZilehuma Hirota is a 51 y.o. old female seen in consultation for sleep problems at the request of Dr. Shawnie Ponsorn.     CHIEF COMPLAINT:   Follow up OSA   HISTORY OF PRESENT ILLNESS   Follow up OSA Doing well Has seen ENT and wants second opinion She is being evaluated for bariatric surgery Patient has sleep study that confirms OSA with AHI 73 Now 100%compliance AHI 0.5 Patient was placed on full mask CPAP 12 cm h20 during the study, she states that she felt good after her sleep study She has intermittent wheezing with extreme activity and cold air  She has inter REVIEW OF SYSTEMS   Review of Systems  Constitutional: Negative for chills, diaphoresis, fever, malaise/fatigue and weight loss.  HENT: Negative for congestion and hearing loss.   Eyes: Negative for blurred vision and double vision.  Respiratory: Negative for cough, hemoptysis, sputum production, shortness of breath and wheezing.   Cardiovascular: Negative for chest pain, palpitations and orthopnea.  Gastrointestinal: Negative for heartburn, nausea and vomiting.  Skin: Negative for rash.  Neurological: Negative for weakness.  All other systems reviewed and are negative.    VS: Pulse 70   Resp 16   Ht 4\' 9"  (1.448 m)   Wt 279 lb (126.6 kg)   SpO2 94%   BMI 60.38 kg/m      PHYSICAL EXAM  Physical Exam  Constitutional: She is oriented to person, place, and time. She appears well-developed and well-nourished. No distress.  Neck: Neck supple.  Cardiovascular: Normal rate, regular  rhythm and normal heart sounds.   No murmur heard. Pulmonary/Chest: Effort normal and breath sounds normal. No stridor. No respiratory distress. She has no wheezes.  Musculoskeletal: Normal range of motion. She exhibits no edema.  Neurological: She is alert and oriented to person, place, and time. No cranial nerve deficit.  Skin: Skin is warm. She is not diaphoretic.  Psychiatric: She has a normal mood and affect.      ASSESSMENT/PLAN   51 yo pleasant [place]  Female with dx of severe OSA with AHI 73, now with AHI 0.5 with CPAP 12 cm h20 With intermittent with reactive airways disease  1.recommend diet and exercise and weight loss 2.will continue  CPAP therapy at 12 cm h20 and then re-assess AHI with compliance report 3.albuterol as needed 4.ENT consult placed  Follow up in 6 months  Patient satisfied with Plan of action and management. All questions answered  Lucie LeatherKurian David Kinsley Nicklaus, M.D.  [facility]  Medical Director [facility] Medical Director [facility]

## 2016-06-02 NOTE — Addendum Note (Signed)
Addended by: Meyer CoryAHMAD, Jearldean Gutt R on: 06/02/2016 12:28 PM   Modules accepted: Orders

## 2016-06-13 ENCOUNTER — Ambulatory Visit: Payer: Medicaid Other | Admitting: Internal Medicine

## 2016-06-15 ENCOUNTER — Other Ambulatory Visit: Payer: Self-pay | Admitting: Surgical Oncology

## 2016-06-15 DIAGNOSIS — K219 Gastro-esophageal reflux disease without esophagitis: Secondary | ICD-10-CM

## 2016-07-07 ENCOUNTER — Other Ambulatory Visit: Payer: Medicaid Other

## 2016-07-13 ENCOUNTER — Other Ambulatory Visit: Payer: Medicaid Other

## 2016-07-21 ENCOUNTER — Other Ambulatory Visit: Payer: Medicaid Other

## 2016-07-27 ENCOUNTER — Ambulatory Visit
Admission: RE | Admit: 2016-07-27 | Discharge: 2016-07-27 | Disposition: A | Discharge status: Home / Self Care | Payer: Medicaid Other | Source: Ambulatory Visit | Attending: Surgical Oncology | Admitting: Surgical Oncology

## 2016-07-27 DIAGNOSIS — K219 Gastro-esophageal reflux disease without esophagitis: Secondary | ICD-10-CM

## 2016-12-27 ENCOUNTER — Encounter: Payer: Self-pay | Admitting: Internal Medicine

## 2017-02-06 ENCOUNTER — Ambulatory Visit (INDEPENDENT_AMBULATORY_CARE_PROVIDER_SITE_OTHER): Payer: Medicaid Other | Admitting: Internal Medicine

## 2017-02-06 ENCOUNTER — Encounter: Payer: Self-pay | Admitting: Internal Medicine

## 2017-02-06 VITALS — BP 150/100 | HR 91 | Ht <= 58 in | Wt 273.0 lb

## 2017-02-06 DIAGNOSIS — G4733 Obstructive sleep apnea (adult) (pediatric): Secondary | ICD-10-CM | POA: Diagnosis not present

## 2017-02-06 NOTE — Patient Instructions (Signed)
Follow up in 1 year.

## 2017-02-06 NOTE — Progress Notes (Signed)
  [facility]      Date: 02/06/2017,   MRN# [id number] Jocelyn Hart 05-Dec-1965 Code Status:  Code Status History    This patient does not have a recorded code status. Please follow your organizational policy for patients in this situation.     Hosp day:@LENGTHOFSTAYDAYS @ Referring MD: @ATDPROV @     PCP:      AdmissionWeight: 273 lb (123.8 kg)                 CurrentWeight: 273 lb (123.8 kg) Jocelyn Hart is a 52 y.o. old female seen in consultation for sleep problems at the request of Dr. Shawnie Ponsorn.     CHIEF COMPLAINT:   Follow up OSA   HISTORY OF PRESENT ILLNESS   Follow up OSA Doing well She is being evaluated for bariatric surgery Patient has sleep study that confirms OSA with AHI 73 Now 100%compliance AHI 0.6 Patient was placed on full mask CPAP 12 cm h20 during the study, she states that she felt good after her sleep study She has intermittent wheezing with extreme activity and cold air  She has inter REVIEW OF SYSTEMS   Review of Systems  Constitutional: Negative for chills, diaphoresis, fever, malaise/fatigue and weight loss.  HENT: Negative for congestion and hearing loss.   Eyes: Negative for blurred vision and double vision.  Respiratory: Negative for cough, hemoptysis, sputum production, shortness of breath and wheezing.   Cardiovascular: Negative for chest pain, palpitations and orthopnea.  Gastrointestinal: Negative for heartburn, nausea and vomiting.  Skin: Negative for rash.  Neurological: Negative for weakness.  All other systems reviewed and are negative.    VS: BP (!) 150/100 (BP Location: Left Arm, Cuff Size: Large)   Pulse 91   Ht 4\' 9"  (1.448 m)   Wt 273 lb (123.8 kg)   SpO2 95%   BMI 59.08 kg/m      PHYSICAL EXAM  Physical Exam  Constitutional: She is oriented to person, place, and time. She appears well-developed and well-nourished. No distress.  Neck: Neck supple.  Cardiovascular: Normal rate,  regular rhythm and normal heart sounds.  No murmur heard. Pulmonary/Chest: Effort normal and breath sounds normal. No stridor. No respiratory distress. She has no wheezes.  Musculoskeletal: Normal range of motion. She exhibits no edema.  Neurological: She is alert and oriented to person, place, and time. No cranial nerve deficit.  Skin: Skin is warm. She is not diaphoretic.  Psychiatric: She has a normal mood and affect.      ASSESSMENT/PLAN   52 yo pleasant [place]  Female with dx of severe OSA with AHI 73, now with AHI 0.6 with CPAP 12 cm h20 With intermittent with reactive airways disease  1.recommend diet and exercise and weight loss Follow up bariatric evaluation 2.will continue  CPAP therapy at 12 cm h20 and then re-assess AHI with compliance report 3.albuterol as needed   Follow up in 6 months  Patient satisfied with Plan of action and management. All questions answered  Lucie LeatherKurian David Tareka Jhaveri, M.D.  [facility]  Medical Director [facility] Medical Director [facility]

## 2017-09-19 ENCOUNTER — Telehealth: Payer: Self-pay | Admitting: Internal Medicine

## 2017-09-19 NOTE — Telephone Encounter (Signed)
Attempted to schedule fu kasa.  Patient seeing another provider closer to home.  Deleting Recall.

## 2019-03-07 IMAGING — RF DG UGI W/ HIGH DENSITY W/KUB
8 series · 14 of 17 positions shown · non-contrast
Comparison: CT abdomen pelvis of 06/27/2016

CLINICAL DATA: Gastroesophageal reflux disease, preop for gastric
sleeve surgery

EXAM:
UPPER GI SERIES WITH KUB
TECHNIQUE: After obtaining a scout radiograph a routine upper GI series was
performed using thin and high density barium.
FLUOROSCOPY TIME:  Fluoroscopy Time:  1 minutes 42 second
Radiation Exposure Index (if provided by the fluoroscopic device):
340 mGy
Number of Acquired Spot Images: 0

[Series 1: one shot · 1 of 1 slices shown (1 of 2)]
[im 1/1]
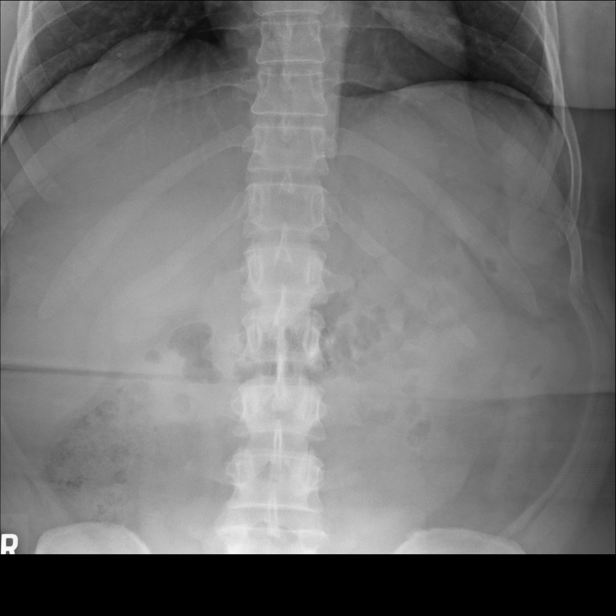

[Series 2: sequence · 2 of 6 frames shown (1 of 6)]
[frame 1/6]
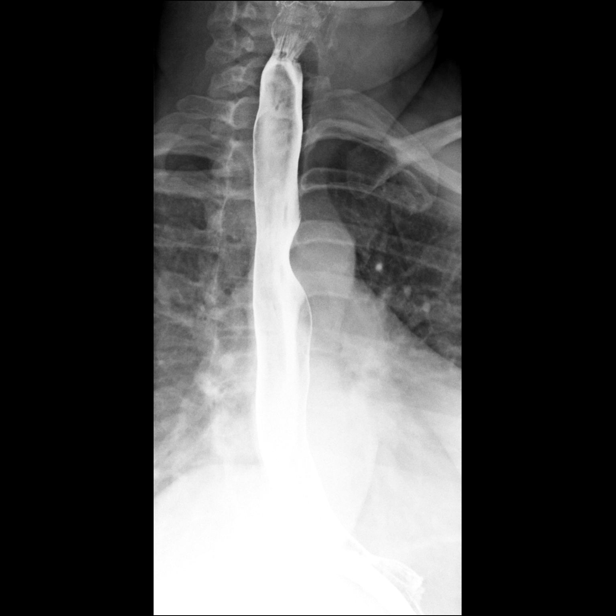
[frame 6/6]
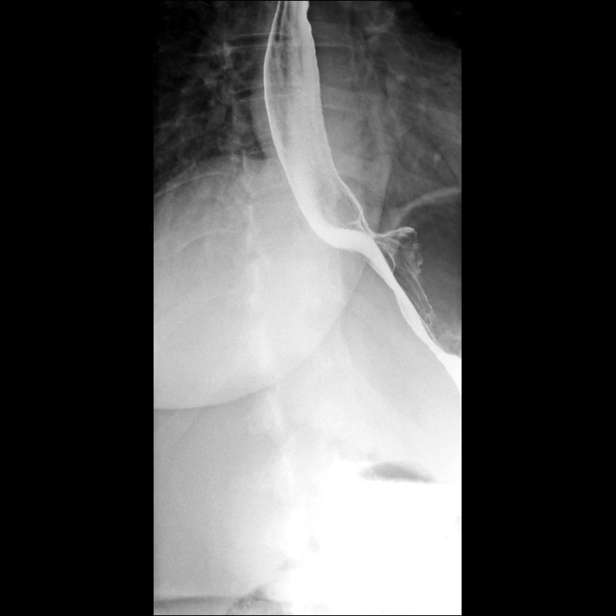

[Series 3: sequence · 1 of 1 slices shown (2 of 6)]
[im 1/1]
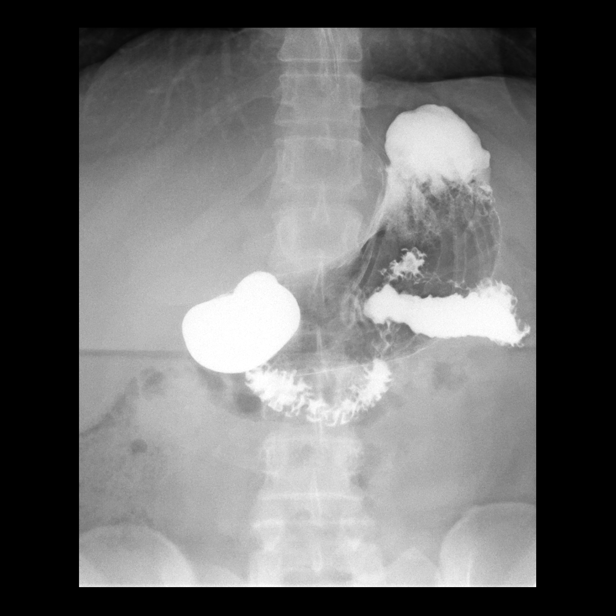

[Series 4: sequence · 1 of 1 slices shown (3 of 6)]
[im 1/1]
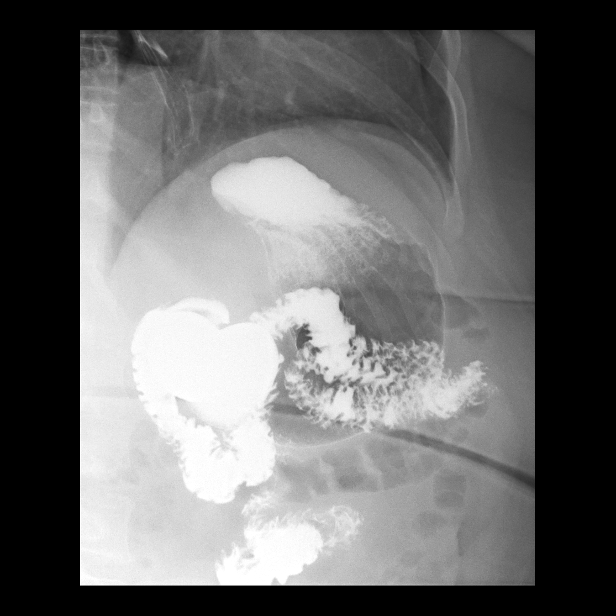

[Series 5: sequence · 1 of 1 slices shown (4 of 6)]
[im 1/1]
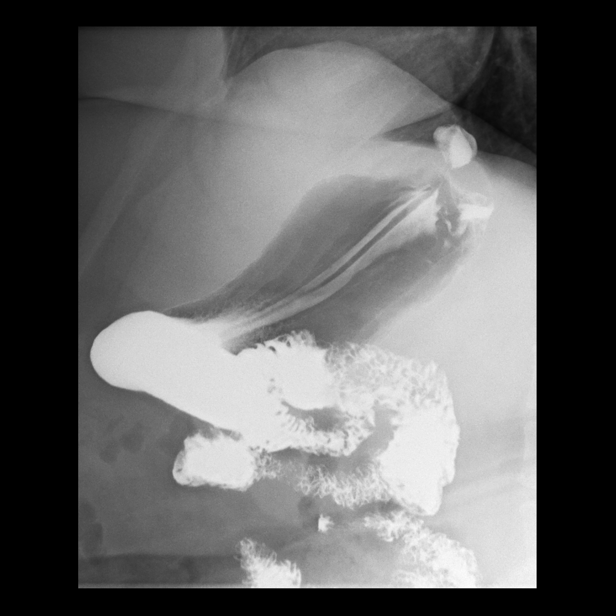

[Series 6: sequence · 1 of 1 slices shown (5 of 6)]
[im 1/1]
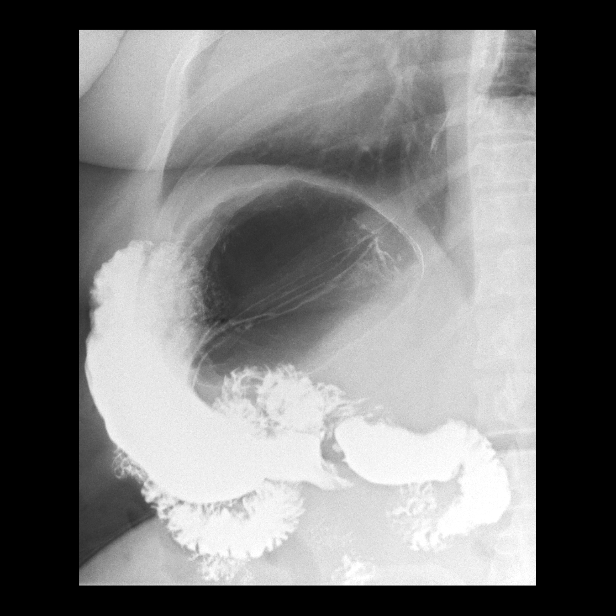

[Series 7: sequence · 3 of 60 frames shown (6 of 6)]
[frame 24/60]
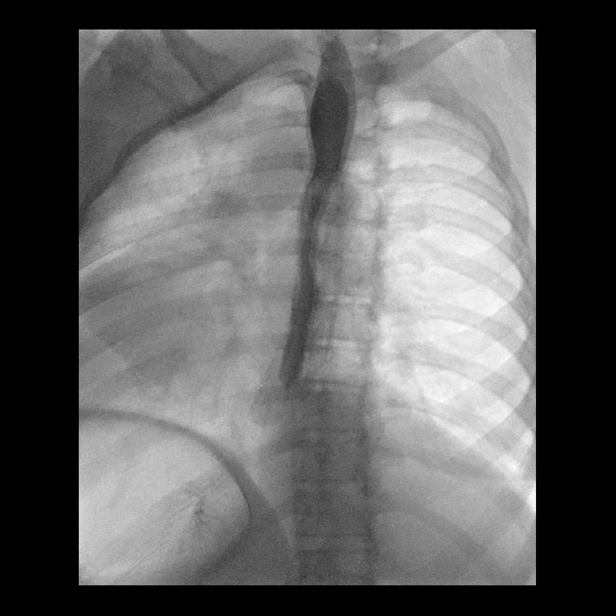
[frame 31/60]
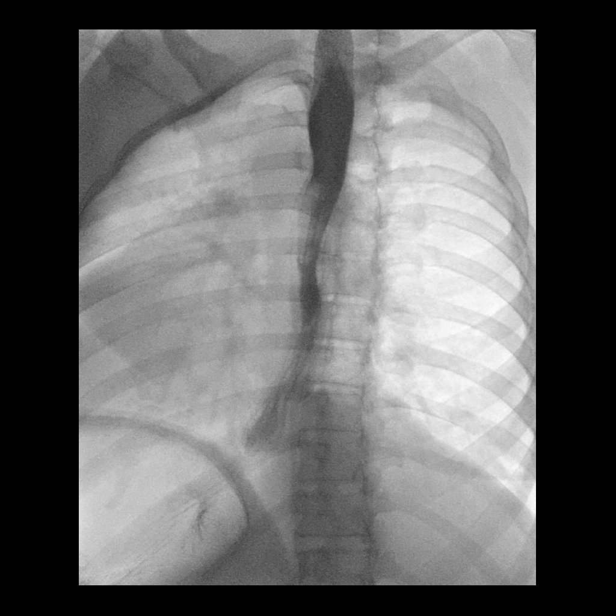
[frame 52/60]
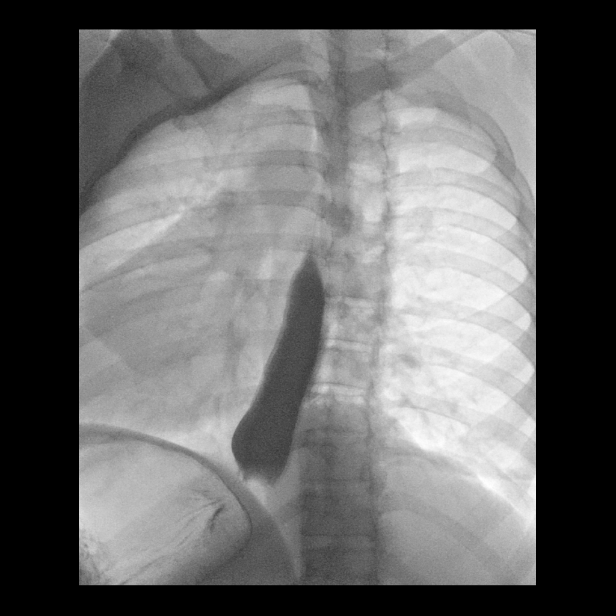

[Series 8: one shot · 4 of 5 slices shown (2 of 2)]
[im 1/5]
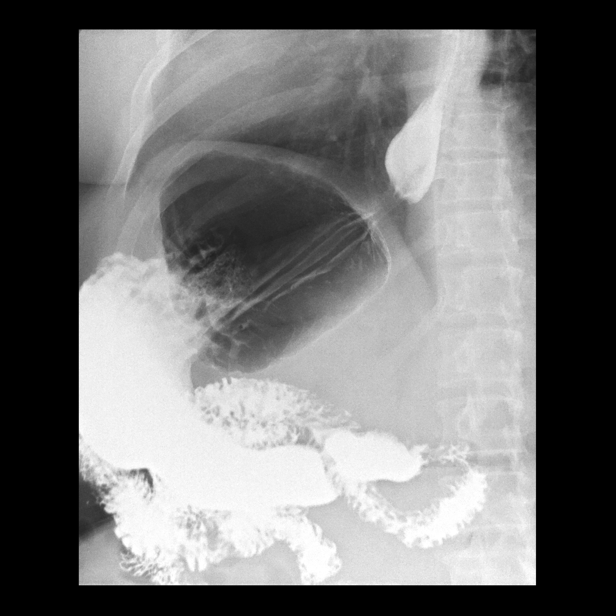
[im 2/5]
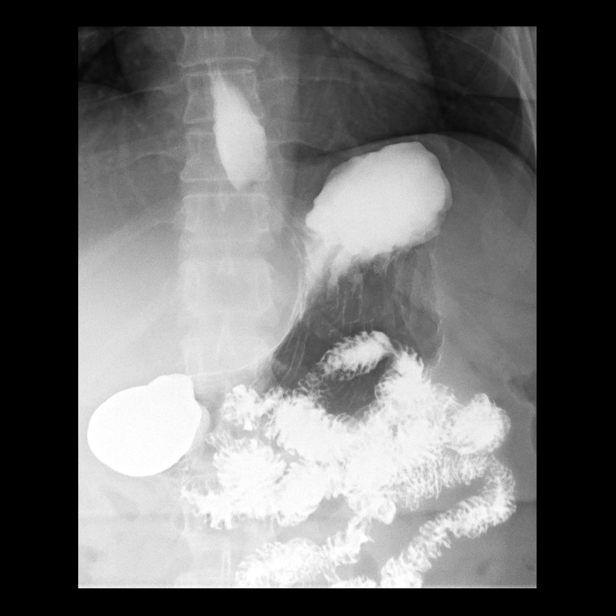
[im 4/5]
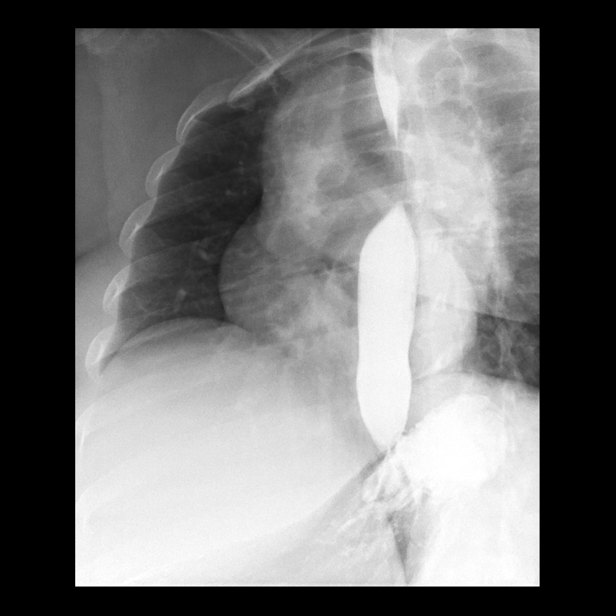
[im 5/5]
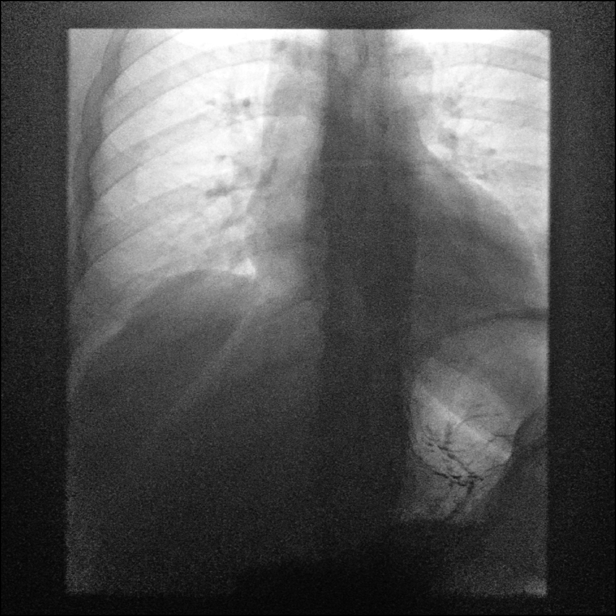

[14 of 17 positions shown; findings below may reference images not displayed]

FINDINGS: A preliminary film of the abdomen shows no bowel obstruction. No
opaque calculi are seen.

A double-contrast study was performed. The mucosa of the esophagus
is unremarkable. A single contrast study shows the swallowing
mechanism to be normal. Esophageal peristalsis is normal. No hiatal
hernia is seen. However moderate gastroesophageal reflux is
demonstrated with the water siphon maneuver at the end of the study.
A barium pill was given which passed into the stomach without delay.

The stomach and duodenum are unremarkable. No ulceration or mass is
seen.
IMPRESSION: 1. Moderate gastroesophageal reflux. Barium pill passes into the
stomach without delay.
2. No hiatal hernia is seen.
3. The stomach and duodenum are unremarkable.

## 2021-05-23 ENCOUNTER — Ambulatory Visit
Admission: RE | Admit: 2021-05-23 | Discharge: 2021-05-23 | Disposition: A | Discharge status: Home / Self Care | Payer: Medicaid Other | Source: Ambulatory Visit | Attending: Emergency Medicine | Admitting: Emergency Medicine

## 2021-05-23 VITALS — BP 159/81 | HR 64 | Temp 98.7°F | Resp 18

## 2021-05-23 DIAGNOSIS — N952 Postmenopausal atrophic vaginitis: Secondary | ICD-10-CM | POA: Diagnosis present

## 2021-05-23 DIAGNOSIS — R350 Frequency of micturition: Secondary | ICD-10-CM | POA: Insufficient documentation

## 2021-05-23 DIAGNOSIS — R5383 Other fatigue: Secondary | ICD-10-CM | POA: Insufficient documentation

## 2021-05-23 DIAGNOSIS — N399 Disorder of urinary system, unspecified: Secondary | ICD-10-CM | POA: Diagnosis not present

## 2021-05-23 DIAGNOSIS — Z760 Encounter for issue of repeat prescription: Secondary | ICD-10-CM | POA: Diagnosis present

## 2021-05-23 DIAGNOSIS — N760 Acute vaginitis: Secondary | ICD-10-CM | POA: Diagnosis present

## 2021-05-23 DIAGNOSIS — R252 Cramp and spasm: Secondary | ICD-10-CM | POA: Diagnosis not present

## 2021-05-23 LAB — POCT URINALYSIS DIP (MANUAL ENTRY)
Bilirubin, UA: NEGATIVE
Blood, UA: NEGATIVE
Glucose, UA: NEGATIVE mg/dL
Ketones, POC UA: NEGATIVE mg/dL
Leukocytes, UA: NEGATIVE
Nitrite, UA: NEGATIVE
Protein Ur, POC: NEGATIVE mg/dL
Spec Grav, UA: <=1.005 — AB (ref 1.010–1.025)
Urobilinogen, UA: 0.2 E.U./dL
pH, UA: 5.5 (ref 5.0–8.0)

## 2021-05-23 MED ORDER — FEXOFENADINE HCL 180 MG PO TABS: 180.0000 mg | ORAL_TABLET | Freq: Every day | ORAL | 1 refills | Status: AC

## 2021-05-23 MED ORDER — FLUCONAZOLE 150 MG PO TABS: ORAL_TABLET | ORAL | 0 refills | Status: AC

## 2021-05-23 MED ORDER — FLUTICASONE PROPIONATE 50 MCG/ACT NA SUSP: 1.0000 | Freq: Every day | NASAL | 1 refills | Status: AC

## 2021-05-23 MED ORDER — SULFAMETHOXAZOLE-TRIMETHOPRIM 800-160 MG PO TABS: 1.0000 | ORAL_TABLET | Freq: Two times a day (BID) | ORAL | 0 refills | Status: AC

## 2021-05-23 MED ORDER — OMEPRAZOLE 20 MG PO CPDR: 20.0000 mg | DELAYED_RELEASE_CAPSULE | Freq: Every day | ORAL | 0 refills | Status: AC | PRN

## 2021-05-23 NOTE — ED Provider Notes (Signed)
?[facility] ? ? ? ?CSN: [id number] ?Arrival date & time: 05/23/21  1345 ?  ? ?HISTORY  ? ?Chief Complaint  ?Patient presents with  ? Fever  ?  Just overall not feeling well, my family did just go through Covid but I tested negative a few days ago. I'm just not sure what this could be. - Entered by patient  ? Urinary Frequency  ? Nasal Congestion  ? Otalgia  ? ?HPI ?Jocelyn Hart is a 56 y.o. female. Patient presents urgent care today states she just does not feel well overall.  Patient states she is doing with her family at this time who have all tested positive for COVID except for her.  Her test was a few days ago.  Patient states she has had some nasal congestion and some clicking sensation in her left ear along with pressure in both ears.  Patient states she has not been coughing but states that her throat feels "irritated".  Patient states she has noticed that she has been urinating more frequently despite being aware that her appetite and her thirst has been less than usual, states she does not think she is drinking enough water for the past few days.  Patient states she currently does not have a primary care provider.  Patient reports subjective fever 2 days ago.  Patient has significantly elevated blood pressure on arrival today.  Vital signs are otherwise normal.  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 ? ?The history is provided by the patient.  ?History reviewed. No pertinent past medical history. ?Patient Active Problem List  ? Diagnosis Date Noted  ? Foreign body sensation in throat 04/07/2016  ? Gastroesophageal reflux disease 04/07/2016  ? Morbid obesity due to excess calories (HCC) 04/07/2016  ? Oropharyngeal dysphagia 04/07/2016  ? OSA on CPAP 04/07/2016  ? Referred ear pain, left 04/07/2016  ? Tinnitus of left ear 04/07/2016  ? ?Past Surgical History:  ?Procedure  Laterality Date  ? CESAREAN SECTION    ? ?OB History   ?No obstetric history on file. ?  ? ?Home Medications   ? ?Prior to Admission medications   ?Medication Sig Start Date End Date Taking? Authorizing Provider  ? ?Family History ?Family History  ?Problem Relation Age of Onset  ? Diabetes Mother   ? Breast cancer Mother   ? Diabetes Father   ? Stroke Paternal Grandfather   ? ?Social History ?Social History  ? ?Tobacco Use  ? Smoking status: Never  ? Smokeless tobacco: Never  ?Substance Use Topics  ? Alcohol use: No  ? Drug use: No  ? ?Allergies   ?Patient has no known allergies. ? ?Review of Systems ?Review of Systems ?Pertinent findings noted in history of present illness.  ? ?Physical Exam ?Triage Vital Signs ?ED Triage Vitals  ?Enc Vitals Group  ?   BP 11/26/20 0827 (!) 147/82  ?   Pulse Rate 11/26/20 0827 72  ?   Resp 11/26/20 0827 18  ?   Temp 11/26/20 0827 98.3 ?F (36.8 ?C)  ?   Temp Source 11/26/20 0827 Oral  ?   SpO2 11/26/20 0827 98 %  ?   Weight --   ?   Height --   ?   Head Circumference --   ?   Peak Flow --   ?   Pain Score 11/26/20 0826 5  ?  Pain Loc --   ?   Pain Edu? --   ?   Excl. in GC? --   ?No data found. ? ?Updated Vital Signs ?BP (!) 159/81 (BP Location: Right Arm)   Pulse 64   Temp 98.7 ?F (37.1 ?C) (Oral)   Resp 18   SpO2 99%  ? ?Physical Exam ?Vitals and nursing note reviewed.  ?Constitutional:   ?   General: She is not in acute distress. ?   Appearance: Normal appearance. She is not ill-appearing.  ?HENT:  ?   Head: Normocephalic and atraumatic.  ?   Salivary Glands: Right salivary gland is not diffusely enlarged or tender. Left salivary gland is not diffusely enlarged or tender.  ?   Right Ear: Ear canal and external ear normal. No drainage. A middle ear effusion is present. There is no impacted cerumen. Tympanic membrane is bulging. Tympanic membrane is not injected or erythematous.  ?   Left Ear: Ear canal and external ear normal. No drainage. A middle ear effusion is present.  There is no impacted cerumen. Tympanic membrane is bulging. Tympanic membrane is not injected or erythematous.  ?   Ears:  ?   Comments: Bilateral EACs normal, both TMs bulging with clear fluid ?   Nose: Rhinorrhea present. No nasal deformity, septal deviation, signs of injury, nasal tenderness, mucosal edema or congestion. Rhinorrhea is clear.  ?   Right Nostril: Occlusion present. No foreign body, epistaxis or septal hematoma.  ?   Left Nostril: Occlusion present. No foreign body, epistaxis or septal hematoma.  ?   Right Turbinates: Enlarged, swollen and pale.  ?   Left Turbinates: Enlarged, swollen and pale.  ?   Right Sinus: No maxillary sinus tenderness or frontal sinus tenderness.  ?   Left Sinus: No maxillary sinus tenderness or frontal sinus tenderness.  ?   Mouth/Throat:  ?   Lips: Pink. No lesions.  ?   Mouth: Mucous membranes are moist. No oral lesions.  ?   Pharynx: Oropharynx is clear. Uvula midline. No posterior oropharyngeal erythema or uvula swelling.  ?   Tonsils: No tonsillar exudate. 0 on the right. 0 on the left.  ?   Comments: Postnasal drip ?Eyes:  ?   General: Lids are normal.     ?   Right eye: No discharge.     ?   Left eye: No discharge.  ?   Extraocular Movements: Extraocular movements intact.  ?   Conjunctiva/sclera: Conjunctivae normal.  ?   Right eye: Right conjunctiva is not injected.  ?   Left eye: Left conjunctiva is not injected.  ?Neck:  ?   Trachea: Trachea and phonation normal.  ?Cardiovascular:  ?   Rate and Rhythm: Normal rate and regular rhythm.  ?   Pulses: Normal pulses.  ?   Heart sounds: Normal heart sounds. No murmur heard. ?  No friction rub. No gallop.  ?Pulmonary:  ?   Effort: Pulmonary effort is normal. No accessory muscle usage, prolonged expiration or respiratory distress.  ?   Breath sounds: Normal breath sounds. No stridor, decreased air movement or transmitted upper airway sounds. No decreased breath sounds, wheezing, rhonchi or rales.  ?Chest:  ?   Chest wall:  No tenderness.  ?Musculoskeletal:     ?   General: Normal range of motion.  ?   Cervical back: Normal range of motion and neck supple. Normal range of motion.  ?Lymphadenopathy:  ?   Cervical: No cervical adenopathy.  ?  Skin: ?   General: Skin is warm and dry.  ?   Findings: No erythema or rash.  ?Neurological:  ?   General: No focal deficit present.  ?   Mental Status: She is alert and oriented to person, place, and time.  ?Psychiatric:     ?   Mood and Affect: Mood normal.     ?   Behavior: Behavior normal.  ? ? ?Visual Acuity ?Right Eye Distance:   ?Left Eye Distance:   ?Bilateral Distance:   ? ?Right Eye Near:   ?Left Eye Near:    ?Bilateral Near:    ? ?UC Couse / Diagnostics / Procedures:  ?  ?EKG ? ?Radiology ?No results found. ? ?Procedures ?Procedures (including critical care time) ? ?UC Diagnoses / Final Clinical Impressions(s)   ?I have reviewed the triage vital signs and the nursing notes. ? ?Pertinent labs & imaging results that were available during my care of the patient were reviewed by me and considered in my medical decision making (see chart for details).   ? ?Final diagnoses:  ?Other fatigue  ?Increased frequency of urination  ?Leg cramps  ?Vaginitis and vulvovaginitis  ?Atrophic vaginitis  ?Urinary tract disorder  ?Medication refill  ? ?STD screening was performed, patient advised that the results be posted to their MyChart and if any of the results are positive, they will be notified by phone, further treatment will be provided as indicated based on results of STD screening. ? ?Urine dip today is unremarkable, culture will be performed per protocol.  Patient will be treated empirically for presumed acute urinary tract infection given symptoms and complaints from patient.  Patient also provided with allergy medication.  Patient further requested a renewal of Prilosec which was provided for her. ? ?ED Prescriptions   ? ? Medication Sig Dispense Auth. Provider  ? sulfamethoxazole-trimethoprim  (BACTRIM DS) 800-160 MG tablet Take 1 tablet by mouth 2 (two) times daily for 3 days. 6 tablet Theadora Rama Scales, PA-C  ? fluconazole (DIFLUCAN) 150 MG tablet Take 1 tablet today.  Take second tablet 3 days later. 2

## 2021-05-23 NOTE — Discharge Instructions (Signed)
Your symptoms and my physical exam findings are concerning for exacerbation of your underlying allergies.  It is important that you begin your allergy regimen now and are consistent with taking allergy medications exactly as prescribed.  Allergy medications are preventative and therefore only work well when they are taken daily, not "as needed". ?  ?Allegra (fexofenadine): This is an excellent second-generation antihistamine that helps to reduce respiratory inflammatory response to environmental allergens.  This medication is not known to cause daytime sleepiness so it can be taken in the daytime.  If you find that it does make you sleepy, please feel free to take it at bedtime. ?  ?Flonase (fluticasone): This is a steroid nasal spray that you use once daily, 1 spray in each nare.  This medication does not work well if you decide to use it only used as you feel you need to, it works best used on a daily basis.  After 3 to 5 days of use, you will notice significant reduction of the inflammation and mucus production that is currently being caused by exposure to allergens, whether seasonal or environmental.  The most common side effect of this medication is nosebleeds.  If you experience a nosebleed, please discontinue use for 1 week, then feel free to resume.  I have provided you with a prescription but you can also purchase this medication over-the-counter if your insurance will not cover it. ?  ?The urinalysis that we performed in the clinic today was normal.  Because you are having symptoms, urine culture will be performed.  The results of the urine culture will be available in the next 3 to 5 days and will be posted to your MyChart account.  If there is an abnormal finding, you will be contacted by phone and advised of further treatment recommendations, if any. ?  ?You were advised to begin antibiotics today because you are having active symptoms of a urinary tract infection.  It is very important that you take  all doses exactly as prescribed.  Incomplete antibiotic therapy can cause worsening urinary tract infection that can become aggressive, reach the level of your kidneys causing kidney infection and possible hospitalization. ? ?Because we know that antibiotic treatment can often cause vaginal yeast infections, I have also provided you with a prescription for fluconazole (Diflucan).  Please take the first tablet on the third day of your antibiotics and take the second tablet two days after the first tablet. ? ?The results of your vaginal swab testing today will be made available to you once they are complete, this typically takes 3 to 5 days.  This test evaluates for yeast, bacterial vaginosis, chlamydia, gonorrhea and trichomonas.  The results will initially be posted to your MyChart and, if any of your results are abnormal, you will receive a phone call with those results along with further instructions regarding any further treatment, if needed.  ?  ?If you have not had complete resolution of your symptoms after completing treatment as prescribed, please return to urgent care for repeat evaluation or follow-up with your primary care provider. ?  ?Thank you for visiting urgent care today.  I appreciate the opportunity to participate in your care. ? ? ? ?

## 2021-05-23 NOTE — ED Triage Notes (Addendum)
Pt states 2 days ago she began having a itchy throat, fever, congestion and ear discomfort.  ? ?Patient states she has urinary frequency and has been taking nitrofurantoin.  ?

## 2021-05-24 LAB — CBC WITH DIFFERENTIAL/PLATELET
Basophils Absolute: 0 10*3/uL (ref 0.0–0.2)
Basos: 0 %
EOS (ABSOLUTE): 0.2 10*3/uL (ref 0.0–0.4)
Eos: 2 %
Hematocrit: 37.6 % (ref 34.0–46.6)
Hemoglobin: 12.9 g/dL (ref 11.1–15.9)
Immature Grans (Abs): 0 10*3/uL (ref 0.0–0.1)
Immature Granulocytes: 0 %
Lymphocytes Absolute: 1.5 10*3/uL (ref 0.7–3.1)
Lymphs: 20 %
MCH: 30.8 pg (ref 26.6–33.0)
MCHC: 34.3 g/dL (ref 31.5–35.7)
MCV: 90 fL (ref 79–97)
Monocytes Absolute: 0.4 10*3/uL (ref 0.1–0.9)
Monocytes: 5 %
Neutrophils Absolute: 5.6 10*3/uL (ref 1.4–7.0)
Neutrophils: 73 %
Platelets: 184 10*3/uL (ref 150–450)
RBC: 4.19 x10E6/uL (ref 3.77–5.28)
RDW: 13.4 % (ref 11.7–15.4)
WBC: 7.8 10*3/uL (ref 3.4–10.8)

## 2021-05-24 LAB — BASIC METABOLIC PANEL
BUN/Creatinine Ratio: 12 (ref 9–23)
BUN: 9 mg/dL (ref 6–24)
CO2: 20 mmol/L (ref 20–29)
Calcium: 8.9 mg/dL (ref 8.7–10.2)
Chloride: 111 mmol/L — ABNORMAL HIGH (ref 96–106)
Creatinine, Ser: 0.74 mg/dL (ref 0.57–1.00)
Glucose: 84 mg/dL (ref 70–99)
Potassium: 4.7 mmol/L (ref 3.5–5.2)
Sodium: 146 mmol/L — ABNORMAL HIGH (ref 134–144)
eGFR: 97 mL/min/{1.73_m2} (ref >59)

## 2021-05-25 LAB — CERVICOVAGINAL ANCILLARY ONLY
Bacterial Vaginitis (gardnerella): NEGATIVE
Candida Glabrata: NEGATIVE
Candida Vaginitis: NEGATIVE
Chlamydia: NEGATIVE
Comment: NEGATIVE
Comment: NEGATIVE
Comment: NEGATIVE
Comment: NEGATIVE
Comment: NEGATIVE
Comment: NORMAL
Neisseria Gonorrhea: NEGATIVE
Trichomonas: NEGATIVE

## 2021-05-25 LAB — URINE CULTURE: Culture: NO GROWTH

## 2021-07-20 ENCOUNTER — Ambulatory Visit
Admission: EM | Admit: 2021-07-20 | Discharge: 2021-07-20 | Disposition: A | Discharge status: Home / Self Care | Payer: Medicaid Other | Attending: Nurse Practitioner | Admitting: Nurse Practitioner

## 2021-07-20 DIAGNOSIS — R233 Spontaneous ecchymoses: Secondary | ICD-10-CM | POA: Diagnosis not present

## 2021-07-20 DIAGNOSIS — S80862A Insect bite (nonvenomous), left lower leg, initial encounter: Secondary | ICD-10-CM | POA: Diagnosis not present

## 2021-07-20 DIAGNOSIS — W57XXXA Bitten or stung by nonvenomous insect and other nonvenomous arthropods, initial encounter: Secondary | ICD-10-CM

## 2021-07-20 DIAGNOSIS — R198 Other specified symptoms and signs involving the digestive system and abdomen: Secondary | ICD-10-CM | POA: Diagnosis not present

## 2021-07-20 LAB — POCT FASTING CBG KUC MANUAL ENTRY: POCT Glucose (KUC): 88 mg/dL (ref 70–99)

## 2021-07-20 MED ORDER — CLOTRIMAZOLE 1 % EX CREA: TOPICAL_CREAM | CUTANEOUS | 0 refills | Status: AC

## 2021-07-20 MED ORDER — HYDROCORTISONE 1 % EX CREA: TOPICAL_CREAM | CUTANEOUS | 0 refills | Status: AC

## 2021-07-20 NOTE — ED Provider Notes (Signed)
[facility]    CSN: [id number] Arrival date & time: 07/20/21  1238      History   Chief Complaint Chief Complaint  Patient presents with   Allergic Reaction    Bite and rash - Entered by patient    HPI Jocelyn Hart is a 56 y.o. female.   Patient presents today with two itchy areas on her left lower extremity and around her belly button.  Reports she has been visiting her daughter who lives in the country and she has been outdoors reading in the hammock a lot.  The itchy areas on her leg have been there a few days and are red.  They are not swollen or draining anything.  Reports she woke up this morning and noticed bruising around the areas and this is concerning to her.  Also reports her belly button is red and irritated and itchy.  Has noticed this the past couple of days. Denies drainage from the belly button, fevers, and nausea/vomiting.  She does have some bruising around her belly button as well.  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    Medical history significant for history of bariatric surgery, dysphagia, obstructive sleep apnea, GERD.    History reviewed. No pertinent past medical history.  Patient Active Problem List   Diagnosis Date Noted   Foreign body sensation in throat 04/07/2016   Gastroesophageal reflux disease 04/07/2016   Morbid obesity due to excess calories ([place]) 04/07/2016   Oropharyngeal dysphagia 04/07/2016   OSA on CPAP 04/07/2016   Referred ear pain, left 04/07/2016   Tinnitus of left ear 04/07/2016    Past Surgical History:  Procedure Laterality Date   CESAREAN SECTION      OB History   No obstetric history on file.      Home Medications    Prior to Admission medications   Medication Sig Start Date End Date Taking? Authorizing Provider  clotrimazole (LOTRIMIN) 1 % cream Apply to affected area 2 times  daily (apply to inside of belly button) 07/20/21  Yes Eulogio Bear, NP  hydrocortisone cream 1 % Apply to affected area 2 times daily (apply to bug bites) 07/20/21  Yes Eulogio Bear, NP  Cholecalciferol 125 MCG (5000 UT) TABS Vitamin D3 125 mcg (5,000 unit) tablet   1 tablet every day by oral route. 05/19/17   [provider]  ferrous sulfate 325 (65 FE) MG tablet Iron (ferrous sulfate) 325 mg (65 mg iron) tablet   1 tablet every day by oral route. 10/17/17   [provider]  fexofenadine (ALLEGRA) 180 MG tablet Take 1 tablet (180 mg total) by mouth daily. 05/23/21 11/19/21  Lynden Oxford Scales, PA-C  fluconazole (DIFLUCAN) 150 MG tablet Take 1 tablet today.  Take second tablet 3 days later. 05/23/21   Lynden Oxford Scales, PA-C  fluticasone (FLONASE) 50 MCG/ACT nasal spray Place 1 spray into both nostrils daily. Begin by using 2 sprays in each nare daily for 3 to 5 days, then decrease to 1 spray in each nare daily. 05/23/21   Lynden Oxford Scales, PA-C  omeprazole (PRILOSEC) 20 MG capsule Take 1 capsule (20 mg total) by mouth daily as needed. 05/23/21   Lynden Oxford Scales, PA-C    Family History Family History  Problem Relation Age of Onset   Diabetes Mother    Breast cancer Mother  Diabetes Father    Stroke Paternal Grandfather     Social History Social History   Tobacco Use   Smoking status: Never   Smokeless tobacco: Never  Substance Use Topics   Alcohol use: No   Drug use: No     Allergies   Patient has no known allergies.   Review of Systems Review of Systems Per HPI  Physical Exam Triage Vital Signs ED Triage Vitals  Enc Vitals Group     BP 07/20/21 1302 125/75     Pulse Rate 07/20/21 1302 70     Resp 07/20/21 1302 20     Temp 07/20/21 1302 98.2 F (36.8 C)     Temp src --      SpO2 07/20/21 1302 97 %     Weight --      Height --      Head Circumference --      Peak Flow --      Pain Score 07/20/21 1255 0     Pain  Loc --      Pain Edu? --      Excl. in GC? --    No data found.  Updated Vital Signs BP 125/75   Pulse 70   Temp 98.2 F (36.8 C)   Resp 20   SpO2 97%   Visual Acuity Right Eye Distance:   Left Eye Distance:   Bilateral Distance:    Right Eye Near:   Left Eye Near:    Bilateral Near:     Physical Exam Vitals and nursing note reviewed.  Constitutional:      General: She is not in acute distress.    Appearance: Normal appearance. She is obese. She is not toxic-appearing.  Pulmonary:     Effort: Pulmonary effort is normal. No respiratory distress.  Musculoskeletal:     Right lower leg: No edema.     Left lower leg: No edema.  Skin:    General: Skin is warm and dry.     Capillary Refill: Capillary refill takes less than 2 seconds.     Findings: Ecchymosis, erythema and rash present. Rash is papular.          Comments: Umbilicus with ulcerated center; bruising around the umbilicus.  No fluctuance, warmth, or drainage.   Erythematous papules to left lower extremity in area marked; there is surrounding ecchymosis  Neurological:     Mental Status: She is alert and oriented to person, place, and time.  Psychiatric:        Behavior: Behavior is cooperative.      UC Treatments / Results  Labs (all labs ordered are listed, but only abnormal results are displayed) Labs Reviewed  CBC WITH DIFFERENTIAL/PLATELET  POCT FASTING CBG [facility] MANUAL ENTRY    EKG   Radiology No results found.  Procedures Procedures (including critical care time)  Medications Ordered in UC Medications - No data to display  Initial Impression / Assessment and Plan / UC Course  I have reviewed the triage vital signs and the nursing notes.  Pertinent labs & imaging results that were available during my care of the patient were reviewed by me and considered in my medical decision making (see chart for details).    Patient is a very pleasant 56 year old female.  I suspect the areas on her  lower extremity are bug bites and the bruising is from itching around them.  Additionally, the bellybutton has a slightly erythematous plaque in the center; this may be related  to fungal irritation.  Start clotrimazole cream to the bellybutton twice daily, hydrocortisone cream around the itchy areas on her leg.  We will check a CBC today to rule out anemia/low platelets.  Also check CBG per patient's request.  Encouraged follow up with PCP if symptoms persist or worsen despite treatment.   Final Clinical Impressions(s) / UC Diagnoses   Final diagnoses:  Insect bite of left lower leg, initial encounter  Abnormal bruising  Anomalies of umbilicus     Discharge Instructions      - Please use the clotrimazole cream to the inside of your belly button to treat if there is a fungal infection - Use the hydrocortisone cream on the itchy areas on your leg - We are checking your blood counts and blood sugar today and will let you know with abnormal results - Blood sugar is normal today  - Follow up with PCP if symptoms persist or worsen despite treatment    ED Prescriptions     Medication Sig Dispense Auth. Provider   clotrimazole (LOTRIMIN) 1 % cream Apply to affected area 2 times daily (apply to inside of belly button) 15 g Cathlean Marseilles A, NP   hydrocortisone cream 1 % Apply to affected area 2 times daily (apply to bug bites) 15 g Valentino Nose, NP      PDMP not reviewed this encounter.   Valentino Nose, NP 07/20/21 1345

## 2021-07-20 NOTE — ED Triage Notes (Signed)
Pt presents with c/o rash on left lower leg  that has unexplained bruising  around it, pt has similar area on umbilicus . Pt also requesting lab work

## 2021-07-20 NOTE — Discharge Instructions (Addendum)
-   Please use the clotrimazole cream to the inside of your belly button to treat if there is a fungal infection - Use the hydrocortisone cream on the itchy areas on your leg - We are checking your blood counts and blood sugar today and will let you know with abnormal results - Blood sugar is normal today  - Follow up with PCP if symptoms persist or worsen despite treatment

## 2021-07-21 LAB — CBC WITH DIFFERENTIAL/PLATELET
Basophils Absolute: 0 10*3/uL (ref 0.0–0.2)
Basos: 1 %
EOS (ABSOLUTE): 0.1 10*3/uL (ref 0.0–0.4)
Eos: 2 %
Hematocrit: 37.7 % (ref 34.0–46.6)
Hemoglobin: 12.4 g/dL (ref 11.1–15.9)
Immature Grans (Abs): 0 10*3/uL (ref 0.0–0.1)
Immature Granulocytes: 0 %
Lymphocytes Absolute: 1.9 10*3/uL (ref 0.7–3.1)
Lymphs: 34 %
MCH: 29.6 pg (ref 26.6–33.0)
MCHC: 32.9 g/dL (ref 31.5–35.7)
MCV: 90 fL (ref 79–97)
Monocytes Absolute: 0.4 10*3/uL (ref 0.1–0.9)
Monocytes: 7 %
Neutrophils Absolute: 3.1 10*3/uL (ref 1.4–7.0)
Neutrophils: 56 %
Platelets: 200 10*3/uL (ref 150–450)
RBC: 4.19 x10E6/uL (ref 3.77–5.28)
RDW: 13.6 % (ref 11.7–15.4)
WBC: 5.7 10*3/uL (ref 3.4–10.8)

## 2021-08-31 ENCOUNTER — Encounter: Payer: Self-pay | Admitting: Emergency Medicine

## 2021-08-31 ENCOUNTER — Ambulatory Visit
Admission: EM | Admit: 2021-08-31 | Discharge: 2021-08-31 | Disposition: A | Discharge status: Home / Self Care | Payer: Medicaid Other | Attending: Family Medicine | Admitting: Family Medicine

## 2021-08-31 ENCOUNTER — Emergency Department (HOSPITAL_COMMUNITY): Payer: Medicaid Other

## 2021-08-31 ENCOUNTER — Ambulatory Visit
Admission: EM | Admit: 2021-08-31 | Discharge: 2021-08-31 | Discharge status: Absent without leave | Payer: Medicaid Other

## 2021-08-31 ENCOUNTER — Other Ambulatory Visit: Payer: Self-pay

## 2021-08-31 ENCOUNTER — Emergency Department (HOSPITAL_COMMUNITY)
Admission: EM | Admit: 2021-08-31 | Discharge: 2021-08-31 | Disposition: A | Discharge status: Home / Self Care | Payer: Medicaid Other | Attending: Emergency Medicine | Admitting: Emergency Medicine

## 2021-08-31 DIAGNOSIS — R319 Hematuria, unspecified: Secondary | ICD-10-CM | POA: Diagnosis not present

## 2021-08-31 DIAGNOSIS — R109 Unspecified abdominal pain: Secondary | ICD-10-CM | POA: Diagnosis not present

## 2021-08-31 DIAGNOSIS — R3129 Other microscopic hematuria: Secondary | ICD-10-CM | POA: Diagnosis not present

## 2021-08-31 LAB — POC URINE PREG, ED: Preg Test, Ur: NEGATIVE

## 2021-08-31 LAB — URINALYSIS, ROUTINE W REFLEX MICROSCOPIC
Bacteria, UA: NONE SEEN
Bilirubin Urine: NEGATIVE
Glucose, UA: NEGATIVE mg/dL
Ketones, ur: NEGATIVE mg/dL
Leukocytes,Ua: NEGATIVE
Nitrite: NEGATIVE
Protein, ur: NEGATIVE mg/dL
Specific Gravity, Urine: 1.003 — ABNORMAL LOW (ref 1.005–1.030)
pH: 6 (ref 5.0–8.0)

## 2021-08-31 LAB — POCT URINALYSIS DIP (MANUAL ENTRY)
Bilirubin, UA: NEGATIVE
Glucose, UA: NEGATIVE mg/dL
Ketones, POC UA: NEGATIVE mg/dL
Leukocytes, UA: NEGATIVE
Nitrite, UA: NEGATIVE
Protein Ur, POC: NEGATIVE mg/dL
Spec Grav, UA: <=1.005 — AB (ref 1.010–1.025)
Urobilinogen, UA: 0.2 E.U./dL
pH, UA: 6 (ref 5.0–8.0)

## 2021-08-31 MED ORDER — TAMSULOSIN HCL 0.4 MG PO CAPS: 0.4000 mg | ORAL_CAPSULE | Freq: Every day | ORAL | 0 refills | Status: AC

## 2021-08-31 MED ORDER — KETOROLAC TROMETHAMINE 30 MG/ML IJ SOLN
30.0000 mg | Freq: Once | INTRAMUSCULAR | Status: AC
  Administered 2021-08-31: 30 mg via INTRAMUSCULAR

## 2021-08-31 NOTE — Discharge Instructions (Signed)
There were no red blood cells seen on the urine testing today in the emergency department.  The CT scan did not show any acute problems including problems related to prior abdominal surgery, kidney stones or sign of kidney infection.  For the discomfort you are having, use ibuprofen 400 mg 3 times a day and heat for pain.  See your doctor for further care and treatment as soon as possible.

## 2021-08-31 NOTE — ED Triage Notes (Signed)
Pt reports left lower back pain that radiates to LLQ and reports intermittent nausea. Pt reports history of kidney stones.

## 2021-08-31 NOTE — ED Triage Notes (Signed)
Pt reports she was seen at [facility] and told she has blood in her urine, pain to left left flank from back to lower left abd; pt reports she was not Rx'ed abx but was given tramadol for pain; pt reports she is unsure what is wrong with her and would like clarification

## 2021-08-31 NOTE — ED Provider Notes (Signed)
[facility] EMERGENCY DEPARTMENT Provider Note   CSN: [id number] Arrival date & time: 08/31/21  2022     History {Add pertinent medical, surgical, social history, OB history to HPI:1} Chief Complaint  Patient presents with   Hematuria    Jocelyn Hart is a 56 y.o. female.  HPI Patient presents for evaluation of left flank pain, radiating to left groin region, for 3 days, colicky.  She was seen today at an urgent care and told that she had blood in her urine.  She was treated with Toradol.  She was unclear about the diagnosis so decided to come here for further evaluation.  She has been told she had kidney stones in the past but not required interventions for them.  She denies fever, chills, nausea or vomiting.  There are no other known active modifying factors.    Home Medications Prior to Admission medications   Medication Sig Start Date End Date Taking? Authorizing Provider  Cholecalciferol 125 MCG (5000 UT) TABS Vitamin D3 125 mcg (5,000 unit) tablet   1 tablet every day by oral route. 05/19/17   [provider]  clotrimazole (LOTRIMIN) 1 % cream Apply to affected area 2 times daily (apply to inside of belly button) 07/20/21   Valentino Nose, NP  ferrous sulfate 325 (65 FE) MG tablet Iron (ferrous sulfate) 325 mg (65 mg iron) tablet   1 tablet every day by oral route. 10/17/17   [provider]  fexofenadine (ALLEGRA) 180 MG tablet Take 1 tablet (180 mg total) by mouth daily. 05/23/21 11/19/21  Theadora Rama Scales, PA-C  fluconazole (DIFLUCAN) 150 MG tablet Take 1 tablet today.  Take second tablet 3 days later. 05/23/21   Theadora Rama Scales, PA-C  fluticasone (FLONASE) 50 MCG/ACT nasal spray Place 1 spray into both nostrils daily. Begin by using 2 sprays in each nare daily for 3 to 5 days, then decrease to 1 spray in each nare daily. 05/23/21   Theadora Rama Scales, PA-C  hydrocortisone cream 1 % Apply to affected area 2 times daily (apply to bug bites)  07/20/21   Valentino Nose, NP  omeprazole (PRILOSEC) 20 MG capsule Take 1 capsule (20 mg total) by mouth daily as needed. 05/23/21   Theadora Rama Scales, PA-C  tamsulosin (FLOMAX) 0.4 MG CAPS capsule Take 1 capsule (0.4 mg total) by mouth daily. 08/31/21   Particia Nearing, PA-C      Allergies    Patient has no known allergies.    Review of Systems   Review of Systems  Physical Exam Updated Vital Signs BP (!) 149/84 (BP Location: Right Arm)   Pulse 66   Temp 98.2 F (36.8 C) (Oral)   Resp 17   Ht 4\' 9"  (1.448 m)   Wt 123.8 kg   SpO2 100%   BMI 59.08 kg/m  Physical Exam Vitals and nursing note reviewed.  Constitutional:      General: She is not in acute distress.    Appearance: She is well-developed. She is not ill-appearing or diaphoretic.  HENT:     Head: Normocephalic and atraumatic.     Right Ear: External ear normal.     Left Ear: External ear normal.  Eyes:     Conjunctiva/sclera: Conjunctivae normal.     Pupils: Pupils are equal, round, and reactive to light.  Neck:     Trachea: Phonation normal.  Cardiovascular:     Rate and Rhythm: Normal rate and regular rhythm.     Heart  sounds: Normal heart sounds.  Pulmonary:     Effort: Pulmonary effort is normal.     Breath sounds: Normal breath sounds.  Abdominal:     Palpations: Abdomen is soft.     Tenderness: There is no abdominal tenderness.  Musculoskeletal:        General: Normal range of motion.     Cervical back: Normal range of motion and neck supple.  Skin:    General: Skin is warm and dry.  Neurological:     Mental Status: She is alert and oriented to person, place, and time.     Cranial Nerves: No cranial nerve deficit.     Sensory: No sensory deficit.     Motor: No abnormal muscle tone.     Coordination: Coordination normal.  Psychiatric:        Behavior: Behavior normal.        Thought Content: Thought content normal.        Judgment: Judgment normal.     ED Results / Procedures /  Treatments   Labs (all labs ordered are listed, but only abnormal results are displayed) Labs Reviewed  URINALYSIS, ROUTINE W REFLEX MICROSCOPIC  POC URINE PREG, ED    EKG None  Radiology No results found.  Procedures Procedures  {Document cardiac monitor, telemetry assessment procedure when appropriate:1}  Medications Ordered in ED Medications - No data to display  ED Course/ Medical Decision Making/ A&P                           Medical Decision Making Amount and/or Complexity of Data Reviewed Labs: ordered.   ***  {Document critical care time when appropriate:1} {Document review of labs and clinical decision tools ie heart score, Chads2Vasc2 etc:1}  {Document your independent review of radiology images, and any outside records:1} {Document your discussion with family members, caretakers, and with consultants:1} {Document social determinants of health affecting pt's care:1} {Document your decision making why or why not admission, treatments were needed:1} Final Clinical Impression(s) / ED Diagnoses Final diagnoses:  None    Rx / DC Orders ED Discharge Orders     None

## 2021-08-31 NOTE — ED Notes (Signed)
Discharge instructions reviewed with patient. Patient had no questions. Patient discharged.

## 2021-09-01 NOTE — ED Provider Notes (Signed)
[facility]    CSN: [id number] Arrival date & time: 08/31/21  1853      History   Chief Complaint Chief Complaint  Patient presents with   Back Pain    HPI Jocelyn Hart is a 56 y.o. female.   Presenting today with new onset left lower back/flank pain, intermittent nausea since yesterday.  The pain is dull and aching in quality, not necessarily worse with certain movements. She denies dysuria, hematuria, constipation, diarrhea, vomiting, fever, chills.  No new medications, diet changes, sick contacts recently.  She states she has a history of kidney stones that felt somewhat similar.  Has tried over-the-counter pain relievers with minimal relief.    History reviewed. No pertinent past medical history.  Patient Active Problem List   Diagnosis Date Noted   Foreign body sensation in throat 04/07/2016   Gastroesophageal reflux disease 04/07/2016   Morbid obesity due to excess calories (HCC) 04/07/2016   Oropharyngeal dysphagia 04/07/2016   OSA on CPAP 04/07/2016   Referred ear pain, left 04/07/2016   Tinnitus of left ear 04/07/2016    Past Surgical History:  Procedure Laterality Date   CESAREAN SECTION      OB History   No obstetric history on file.      Home Medications    Prior to Admission medications   Medication Sig Start Date End Date Taking? Authorizing Provider  tamsulosin (FLOMAX) 0.4 MG CAPS capsule Take 1 capsule (0.4 mg total) by mouth daily. 08/31/21  Yes Particia Nearing, PA-C  Cholecalciferol 125 MCG (5000 UT) TABS Vitamin D3 125 mcg (5,000 unit) tablet   1 tablet every day by oral route. 05/19/17   [provider]  clotrimazole (LOTRIMIN) 1 % cream Apply to affected area 2 times daily (apply to inside of belly button) 07/20/21   Valentino Nose, NP  ferrous sulfate 325 (65 FE) MG tablet Iron (ferrous sulfate) 325 mg (65 mg iron) tablet   1 tablet every day by oral route. 10/17/17   [provider]  fexofenadine  (ALLEGRA) 180 MG tablet Take 1 tablet (180 mg total) by mouth daily. 05/23/21 11/19/21  Theadora Rama Scales, PA-C  fluconazole (DIFLUCAN) 150 MG tablet Take 1 tablet today.  Take second tablet 3 days later. 05/23/21   Theadora Rama Scales, PA-C  fluticasone (FLONASE) 50 MCG/ACT nasal spray Place 1 spray into both nostrils daily. Begin by using 2 sprays in each nare daily for 3 to 5 days, then decrease to 1 spray in each nare daily. 05/23/21   Theadora Rama Scales, PA-C  hydrocortisone cream 1 % Apply to affected area 2 times daily (apply to bug bites) 07/20/21   Valentino Nose, NP  omeprazole (PRILOSEC) 20 MG capsule Take 1 capsule (20 mg total) by mouth daily as needed. 05/23/21   Theadora Rama Scales, PA-C    Family History Family History  Problem Relation Age of Onset   Diabetes Mother    Breast cancer Mother    Diabetes Father    Stroke Paternal Grandfather     Social History Social History   Tobacco Use   Smoking status: Never   Smokeless tobacco: Never  Substance Use Topics   Alcohol use: No   Drug use: No     Allergies   Patient has no known allergies.   Review of Systems Review of Systems Per HPI  Physical Exam Triage Vital Signs ED Triage Vitals  Enc Vitals Group     BP 08/31/21 1915 137/87  Pulse Rate 08/31/21 1915 73     Resp 08/31/21 1915 20     Temp 08/31/21 1915 98.6 F (37 C)     Temp Source 08/31/21 1915 Oral     SpO2 08/31/21 1915 95 %     Weight --      Height --      Head Circumference --      Peak Flow --      Pain Score 08/31/21 1916 7     Pain Loc --      Pain Edu? --      Excl. in GC? --    No data found.  Updated Vital Signs BP 137/87 (BP Location: Right Arm)   Pulse 73   Temp 98.6 F (37 C) (Oral)   Resp 20   SpO2 95%   Visual Acuity Right Eye Distance:   Left Eye Distance:   Bilateral Distance:    Right Eye Near:   Left Eye Near:    Bilateral Near:     Physical Exam Vitals and nursing note reviewed.   Constitutional:      Appearance: Normal appearance. She is not ill-appearing.  HENT:     Head: Atraumatic.     Mouth/Throat:     Mouth: Mucous membranes are moist.  Eyes:     Extraocular Movements: Extraocular movements intact.     Conjunctiva/sclera: Conjunctivae normal.  Cardiovascular:     Rate and Rhythm: Normal rate and regular rhythm.     Heart sounds: Normal heart sounds.  Pulmonary:     Effort: Pulmonary effort is normal.     Breath sounds: Normal breath sounds.  Abdominal:     General: Bowel sounds are normal. There is no distension.     Palpations: Abdomen is soft.     Tenderness: There is abdominal tenderness. There is left CVA tenderness. There is no right CVA tenderness or guarding.     Comments: Mild left lower quadrant tenderness to palpation extending to left flank and left low back  Musculoskeletal:        General: Normal range of motion.     Cervical back: Normal range of motion and neck supple.  Skin:    General: Skin is warm and dry.     Findings: No bruising or erythema.  Neurological:     Mental Status: She is alert and oriented to person, place, and time.     Motor: No weakness.     Gait: Gait normal.  Psychiatric:        Mood and Affect: Mood normal.        Thought Content: Thought content normal.        Judgment: Judgment normal.      UC Treatments / Results  Labs (all labs ordered are listed, but only abnormal results are displayed) Labs Reviewed  POCT URINALYSIS DIP (MANUAL ENTRY) - Abnormal; Notable for the following components:      Result Value   Spec Grav, UA <=1.005 (*)    Blood, UA trace-intact (*)    All other components within normal limits    EKG   Radiology CT Renal Stone Study  Result Date: 08/31/2021 CLINICAL DATA:  Left-sided flank pain with hematuria EXAM: CT ABDOMEN AND PELVIS WITHOUT CONTRAST TECHNIQUE: Multidetector CT imaging of the abdomen and pelvis was performed following the standard protocol without IV contrast.  RADIATION DOSE REDUCTION: This exam was performed according to the departmental dose-optimization program which includes automated exposure control, adjustment of the mA  and/or kV according to patient size and/or use of iterative reconstruction technique. COMPARISON:  None Available. FINDINGS: Lower chest: No acute abnormality. Hepatobiliary: Gallbladder has been surgically removed. Liver is within normal limits. Pancreas: Unremarkable. No pancreatic ductal dilatation or surrounding inflammatory changes. Spleen: Normal in size without focal abnormality. Adrenals/Urinary Tract: Adrenal glands are within normal limits. Kidneys are well visualized bilaterally. No renal calculi are seen. Bladder is decompressed. No obstructive changes are noted. Stomach/Bowel: The appendix is within normal limits. No obstructive or inflammatory changes of the colon are seen. The small bowel is unremarkable. Postsurgical changes in the stomach are seen. Vascular/Lymphatic: Aortic atherosclerosis. No enlarged abdominal or pelvic lymph nodes. Reproductive: Uterus and bilateral adnexa are unremarkable. Other: No free fluid is noted. There is a bilobed fat containing hernia identified chest below the umbilicus extending to both the right and left of prior surgical scar Musculoskeletal: No acute or significant osseous findings. IMPRESSION: Anterior abdominal wall fat containing hernias. No other focal abnormality is noted. Electronically Signed   By: Alcide Clever M.D.   On: 08/31/2021 22:31    Procedures Procedures (including critical care time)  Medications Ordered in UC Medications  ketorolac (TORADOL) 30 MG/ML injection 30 mg (30 mg Intramuscular Given 08/31/21 1950)    Initial Impression / Assessment and Plan / UC Course  I have reviewed the triage vital signs and the nursing notes.  Pertinent labs & imaging results that were available during my care of the patient were reviewed by me and considered in my medical decision  making (see chart for details).     Given exam findings, microscopic hematuria on urinalysis as well as history of kidney stone possibly she could be experiencing kidney stone pain.  The area is tender to palpation in her low back musculature so could also be muscular pain though not typically exertional per patient.  Will trial IM Toradol, Flomax, fluids and close follow-up if not improving.  ER if worsening.  Final Clinical Impressions(s) / UC Diagnoses   Final diagnoses:  Left flank pain  Microscopic hematuria   Discharge Instructions   None    ED Prescriptions     Medication Sig Dispense Auth. Provider   tamsulosin (FLOMAX) 0.4 MG CAPS capsule Take 1 capsule (0.4 mg total) by mouth daily. 14 capsule Particia Nearing, [place]      PDMP not reviewed this encounter.   Particia Nearing, [place] 09/01/21 [phone]
